# Patient Record
Sex: Male | Born: 1980 | Race: White | Hispanic: No | Smoking: Never smoker
Health system: Southern US, Community
[De-identification: ages and names within clinical notes are randomized; demographics above are authoritative.]

## PROBLEM LIST (undated history)

## (undated) DIAGNOSIS — I10 Essential (primary) hypertension: Secondary | ICD-10-CM

## (undated) DIAGNOSIS — M109 Gout, unspecified: Secondary | ICD-10-CM

---

## 2002-07-08 ENCOUNTER — Emergency Department (HOSPITAL_COMMUNITY): Admission: EM | Admit: 2002-07-08 | Discharge: 2002-07-08 | Payer: Self-pay | Admitting: Emergency Medicine

## 2005-04-13 ENCOUNTER — Emergency Department (HOSPITAL_COMMUNITY): Admission: EM | Admit: 2005-04-13 | Discharge: 2005-04-13 | Payer: Self-pay | Admitting: Emergency Medicine

## 2007-06-29 ENCOUNTER — Emergency Department (HOSPITAL_COMMUNITY): Admission: EM | Admit: 2007-06-29 | Discharge: 2007-06-29 | Payer: Self-pay | Admitting: Family Medicine

## 2010-10-22 ENCOUNTER — Inpatient Hospital Stay (INDEPENDENT_AMBULATORY_CARE_PROVIDER_SITE_OTHER)
Admission: RE | Admit: 2010-10-22 | Discharge: 2010-10-22 | Disposition: A | Discharge status: Home / Self Care | Payer: Self-pay | Source: Ambulatory Visit | Attending: Family Medicine | Admitting: Family Medicine

## 2010-10-22 ENCOUNTER — Emergency Department (HOSPITAL_COMMUNITY)
Admission: EM | Admit: 2010-10-22 | Discharge: 2010-10-22 | Disposition: A | Discharge status: Home / Self Care | Payer: Self-pay | Attending: Emergency Medicine | Admitting: Emergency Medicine

## 2010-10-22 DIAGNOSIS — N201 Calculus of ureter: Secondary | ICD-10-CM

## 2010-10-22 DIAGNOSIS — N509 Disorder of male genital organs, unspecified: Secondary | ICD-10-CM | POA: Insufficient documentation

## 2010-10-22 DIAGNOSIS — R109 Unspecified abdominal pain: Secondary | ICD-10-CM | POA: Insufficient documentation

## 2010-10-22 DIAGNOSIS — R112 Nausea with vomiting, unspecified: Secondary | ICD-10-CM | POA: Insufficient documentation

## 2010-10-22 LAB — URINALYSIS, ROUTINE W REFLEX MICROSCOPIC
Bilirubin Urine: NEGATIVE
Glucose, UA: NEGATIVE mg/dL
Ketones, ur: NEGATIVE mg/dL
Leukocytes, UA: NEGATIVE
Protein, ur: NEGATIVE mg/dL
Specific Gravity, Urine: 1.025 (ref 1.005–1.030)
Urobilinogen, UA: 0.2 mg/dL (ref 0.0–1.0)
pH: 5 (ref 5.0–8.0)

## 2010-10-22 LAB — POCT URINALYSIS DIP (DEVICE)
Bilirubin Urine: NEGATIVE
Glucose, UA: NEGATIVE mg/dL
Ketones, ur: NEGATIVE mg/dL
Nitrite: NEGATIVE
Protein, ur: NEGATIVE mg/dL
Specific Gravity, Urine: >=1.03 (ref 1.005–1.030)
Urobilinogen, UA: 0.2 mg/dL (ref 0.0–1.0)
pH: 5.5 (ref 5.0–8.0)

## 2010-10-22 LAB — URINE MICROSCOPIC-ADD ON

## 2010-11-21 ENCOUNTER — Emergency Department (HOSPITAL_COMMUNITY): Payer: Self-pay

## 2010-11-21 ENCOUNTER — Emergency Department (HOSPITAL_COMMUNITY)
Admission: EM | Admit: 2010-11-21 | Discharge: 2010-11-22 | Disposition: A | Discharge status: Home / Self Care | Payer: Self-pay | Attending: Emergency Medicine | Admitting: Emergency Medicine

## 2010-11-21 DIAGNOSIS — R112 Nausea with vomiting, unspecified: Secondary | ICD-10-CM | POA: Insufficient documentation

## 2010-11-21 DIAGNOSIS — R109 Unspecified abdominal pain: Secondary | ICD-10-CM | POA: Insufficient documentation

## 2010-11-21 DIAGNOSIS — N289 Disorder of kidney and ureter, unspecified: Secondary | ICD-10-CM | POA: Insufficient documentation

## 2010-11-21 DIAGNOSIS — N201 Calculus of ureter: Secondary | ICD-10-CM | POA: Insufficient documentation

## 2010-11-21 LAB — URINALYSIS, ROUTINE W REFLEX MICROSCOPIC
Bilirubin Urine: NEGATIVE
Glucose, UA: NEGATIVE mg/dL
Ketones, ur: NEGATIVE mg/dL
Nitrite: NEGATIVE
Protein, ur: NEGATIVE mg/dL
Urobilinogen, UA: 0.2 mg/dL (ref 0.0–1.0)
pH: 7 (ref 5.0–8.0)

## 2010-11-21 LAB — DIFFERENTIAL
Basophils Absolute: 0 10*3/uL (ref 0.0–0.1)
Basophils Relative: 0 % (ref 0–1)
Eosinophils Absolute: 0.1 10*3/uL (ref 0.0–0.7)
Eosinophils Relative: 1 % (ref 0–5)
Lymphocytes Relative: 7 % — ABNORMAL LOW (ref 12–46)
Lymphs Abs: 1 10*3/uL (ref 0.7–4.0)
Monocytes Absolute: 1 10*3/uL (ref 0.1–1.0)
Monocytes Relative: 7 % (ref 3–12)
Neutro Abs: 13.1 10*3/uL — ABNORMAL HIGH (ref 1.7–7.7)
Neutrophils Relative %: 86 % — ABNORMAL HIGH (ref 43–77)

## 2010-11-21 LAB — COMPREHENSIVE METABOLIC PANEL
ALT: 40 U/L (ref 0–53)
AST: 25 U/L (ref 0–37)
Albumin: 4.2 g/dL (ref 3.5–5.2)
Alkaline Phosphatase: 52 U/L (ref 39–117)
BUN: 22 mg/dL (ref 6–23)
CO2: 27 mEq/L (ref 19–32)
Calcium: 9.4 mg/dL (ref 8.4–10.5)
Chloride: 105 mEq/L (ref 96–112)
Glucose, Bld: 120 mg/dL — ABNORMAL HIGH (ref 70–99)
Potassium: 4.2 mEq/L (ref 3.5–5.1)
Sodium: 141 mEq/L (ref 135–145)
Total Bilirubin: 0.5 mg/dL (ref 0.3–1.2)
Total Protein: 7.4 g/dL (ref 6.0–8.3)

## 2010-11-21 LAB — CBC
HCT: 42.7 % (ref 39.0–52.0)
Hemoglobin: 15 g/dL (ref 13.0–17.0)
MCHC: 35.1 g/dL (ref 30.0–36.0)
MCV: 81.8 fL (ref 78.0–100.0)
RBC: 5.22 MIL/uL (ref 4.22–5.81)
RDW: 12.8 % (ref 11.5–15.5)
WBC: 15.2 10*3/uL — ABNORMAL HIGH (ref 4.0–10.5)

## 2010-11-21 LAB — LIPASE, BLOOD: Lipase: 19 U/L (ref 11–59)

## 2010-11-29 ENCOUNTER — Other Ambulatory Visit: Payer: Self-pay | Admitting: Urology

## 2010-11-29 ENCOUNTER — Encounter (HOSPITAL_COMMUNITY): Payer: Self-pay | Attending: Urology

## 2010-11-29 DIAGNOSIS — Z01818 Encounter for other preprocedural examination: Secondary | ICD-10-CM | POA: Insufficient documentation

## 2010-11-29 DIAGNOSIS — Z01812 Encounter for preprocedural laboratory examination: Secondary | ICD-10-CM | POA: Insufficient documentation

## 2010-11-29 LAB — CBC
HCT: 45 % (ref 39.0–52.0)
Hemoglobin: 15.5 g/dL (ref 13.0–17.0)
MCH: 28.1 pg (ref 26.0–34.0)
MCHC: 34.4 g/dL (ref 30.0–36.0)
MCV: 81.7 fL (ref 78.0–100.0)
Platelets: 242 10*3/uL (ref 150–400)
RBC: 5.51 MIL/uL (ref 4.22–5.81)
RDW: 12.5 % (ref 11.5–15.5)
WBC: 8.8 10*3/uL (ref 4.0–10.5)

## 2010-11-29 LAB — SURGICAL PCR SCREEN: Staphylococcus aureus: POSITIVE — AB

## 2010-12-04 ENCOUNTER — Ambulatory Visit (HOSPITAL_COMMUNITY)
Admission: RE | Admit: 2010-12-04 | Discharge: 2010-12-04 | Disposition: A | Discharge status: Home / Self Care | Payer: Self-pay | Source: Ambulatory Visit | Attending: Urology | Admitting: Urology

## 2010-12-04 DIAGNOSIS — N201 Calculus of ureter: Secondary | ICD-10-CM | POA: Insufficient documentation

## 2010-12-09 NOTE — Op Note (Signed)
  NAMETYHEIM, VANALSTYNE NO.:  [id number]  MEDICAL RECORD NO.:  [id number]           PATIENT TYPE:  O  LOCATION:  DAYL                         FACILITY:  [facility]  PHYSICIAN:  Valetta Fuller, M.D.  DATE OF BIRTH:  1981/04/16  DATE OF PROCEDURE:  12/04/2010 DATE OF DISCHARGE:                              OPERATIVE REPORT   PREOPERATIVE DIAGNOSIS:  Right distal ureteral calculus 6 mm.  POSTOPERATIVE DIAGNOSIS:  Right distal ureteral calculus 6 mm.  PROCEDURE PERFORMED:  Cystoscopy, right retrograde pyelogram, right ureteroscopy with holmium laser lithotripsy, basketing the stone fragments and placement of double-J stent.  SURGEON:  Valetta Fuller, M.D.  ANESTHESIA:  General.  INDICATIONS:  Mr. Rohr is 30 years of age.  He was sent from the emergency room due to a distal right ureteral stone causing obstruction and mild renal insufficiency.  He had been having intermittent testicular discomfort for a number of days.  CT revealed a 4 x 6 mm stone in his distal ureter.  The patient underwent discussion about treatment options.  He elected to proceed with definitive intervention since it appeared the stone has been present in the ureter for approximately a month.  The procedure was discussed with him in detail including pros and cons, potential risks and benefits.  Full informed consent was obtained.  TECHNIQUE AND FINDINGS:  The patient was brought to the operating room. He had successful induction of general anesthesia.  He was placed in lithotomy position and prepped and draped in usual manner.  He received perioperative ciprofloxacin.  Appropriate surgical time-out was performed.  Cystoscopy revealed unremarkable anterior urethra and bladder.  Retrograde pyelogram was done with a cone-tipped catheter revealing a filling defect in the distal right ureter approximately 3 cm to 4 cm above his ureterovesical junction.  Fluoroscopic interpretation was used for  retrograde pyelogram.  A guidewire was placed up to the right renal pelvis.  The right distal ureter was engaged with the rigid ureteroscope but the intramural portions appeared to be somewhat snugged, for that reason it was removed and the inside portion of an access sheath was placed over the wire to perform one-step ureteral dilation.  The distal ureter was then easily engaged with the ureteroscope.  A 6-mm stone was encountered.  We elected to try to fracture this into smaller pieces and a 360-micron holmium laser fiber was utilized with standard settings.  The stone was broken into approximately half a dozen pieces which were each individually basket extracted.  No evidence of ureteral injury. We did elect to place a 6-French 24-cm double-J stent with a dangle string over the guidewire with fluoroscopic as well as visual guidance and the dangle string was secured to his penis.  The patient had no obvious complications and was brought to recovery room in stable condition.     Valetta Fuller, M.D.     DSG/MEDQ  D:  12/04/2010  T:  12/04/2010  Job:  [id number]  Electronically Signed by Barron Alvine M.D. on 12/09/2010 12:14:06 PM

## 2019-12-23 DIAGNOSIS — J309 Allergic rhinitis, unspecified: Secondary | ICD-10-CM | POA: Insufficient documentation

## 2020-10-16 ENCOUNTER — Emergency Department (HOSPITAL_COMMUNITY): Payer: 59

## 2020-10-16 ENCOUNTER — Other Ambulatory Visit: Payer: Self-pay

## 2020-10-16 ENCOUNTER — Encounter (HOSPITAL_COMMUNITY): Payer: Self-pay

## 2020-10-16 ENCOUNTER — Emergency Department (HOSPITAL_COMMUNITY)
Admission: EM | Admit: 2020-10-16 | Discharge: 2020-10-16 | Disposition: A | Discharge status: Home / Self Care | Payer: 59 | Attending: Emergency Medicine | Admitting: Emergency Medicine

## 2020-10-16 DIAGNOSIS — M79671 Pain in right foot: Secondary | ICD-10-CM | POA: Diagnosis not present

## 2020-10-16 DIAGNOSIS — M25571 Pain in right ankle and joints of right foot: Secondary | ICD-10-CM | POA: Insufficient documentation

## 2020-10-16 MED ORDER — PREDNISONE 20 MG PO TABS
40.0000 mg | ORAL_TABLET | Freq: Once | ORAL | Status: AC
  Administered 2020-10-16: 40 mg via ORAL
  Filled 2020-10-16: qty 2

## 2020-10-16 MED ORDER — OXYCODONE-ACETAMINOPHEN 5-325 MG PO TABS: 2.0000 | ORAL_TABLET | Freq: Once | ORAL | Status: DC

## 2020-10-16 MED ORDER — PREDNISONE 20 MG PO TABS: 40.0000 mg | ORAL_TABLET | Freq: Every day | ORAL | 0 refills | Status: DC

## 2020-10-16 MED ORDER — OXYCODONE-ACETAMINOPHEN 5-325 MG PO TABS: 1.0000 | ORAL_TABLET | Freq: Four times a day (QID) | ORAL | 0 refills | Status: DC | PRN

## 2020-10-16 NOTE — ED Notes (Signed)
Pt given crutches, return demonstration performed and questions answered.

## 2020-10-16 NOTE — ED Provider Notes (Signed)
[facility] EMERGENCY DEPARTMENT Provider Note   CSN: [id number] Arrival date & time: 10/16/20  0413     History Chief Complaint  Patient presents with  . Foot Pain    Timothy Bennett is a 40 y.o. male.  Patient with past medical history notable for gout, presents to the emergency department with a chief complaint of right ankle and foot pain.  He denies any known trauma.  States that he does sleep a lot, so he may have bumped it while sleepwalking last night.  He states that he has had gradually worsening pain over the past couple of days.  States that it hurts with bending the ankle.  Denies any fevers or chills.  The history is provided by the patient. No language interpreter was used.       History reviewed. No pertinent past medical history.  There are no problems to display for this patient.   History reviewed. No pertinent surgical history.     History reviewed. No pertinent family history.  Social History   Tobacco Use  . Smoking status: Never Smoker  . Smokeless tobacco: Never Used    Home Medications Prior to Admission medications   Medication Sig Start Date End Date Taking? Authorizing Provider  oxyCODONE-acetaminophen (PERCOCET) 5-325 MG tablet Take 1-2 tablets by mouth every 6 (six) hours as needed. 10/16/20  Yes Roxy Horseman, PA-C  predniSONE (DELTASONE) 20 MG tablet Take 2 tablets (40 mg total) by mouth daily. Take 40 mg by mouth daily for 3 days, then 20mg  by mouth daily for 3 days, then 10mg  daily for 3 days 10/16/20  Yes , PA-C    Allergies    Patient has no allergy information on record.  Review of Systems   Review of Systems  All other systems reviewed and are negative.   Physical Exam Updated Vital Signs BP (!) 150/94 (BP Location: Right Arm)   Pulse 88   Temp 98.7 F (37.1 C)   Resp (!) 22   Ht 5\' 9"  (1.753 m)   Wt 86.2 kg   SpO2 98%   BMI 28.06 kg/m   Physical Exam Vitals and nursing note  reviewed.  Constitutional:      General: He is not in acute distress.    Appearance: He is well-developed. He is not ill-appearing.  HENT:     Head: Normocephalic and atraumatic.  Eyes:     Conjunctiva/sclera: Conjunctivae normal.  Cardiovascular:     Rate and Rhythm: Normal rate.  Pulmonary:     Effort: Pulmonary effort is normal. No respiratory distress.  Abdominal:     General: There is no distension.  Musculoskeletal:     Cervical back: Neck supple.     Comments: Right ankle range of motion and strength is 4/5 limited by pain No obvious swelling  Skin:    General: Skin is warm and dry.     Comments: Mild erythema over the right medial malleolus, no sign of cellulitis or abscess  Neurological:     Mental Status: He is alert and oriented to person, place, and time.  Psychiatric:        Mood and Affect: Mood normal.        Behavior: Behavior normal.     ED Results / Procedures / Treatments   Labs (all labs ordered are listed, but only abnormal results are displayed) Labs Reviewed - No data to display  EKG None  Radiology DG Ankle Complete Right  Result Date: 10/16/2020  CLINICAL DATA:  Right ankle pain. EXAM: RIGHT ANKLE - COMPLETE 3+ VIEW COMPARISON:  None. FINDINGS: Three view radiograph right ankle demonstrates normal alignment. No acute fracture or dislocation. Corticated density seen inferomedial to the medial malleolus likely represents an accessory ossicle or sequela of remote trauma. The ankle mortise is intact. Moderate tibiotalar effusion. Mild bimalleolar soft tissue swelling. Small superior calcaneal spur. IMPRESSION: Ankle effusion. Bimalleolar soft tissue swelling. No acute fracture or dislocation. Electronically Signed   By: Helyn Numbers MD   On: 10/16/2020 05:04   DG Foot Complete Right  Result Date: 10/16/2020 CLINICAL DATA:  Right foot pain EXAM: RIGHT FOOT COMPLETE - 3+ VIEW COMPARISON:  None. FINDINGS: Three view radiograph right foot demonstrates  normal alignment. No fracture or dislocation. Joint spaces are preserved. Moderate tibiotalar effusion. Small superior calcaneal spur. There is soft tissue swelling involving the plantar and medial soft tissues adjacent to the first metatarsal. No retained radiopaque foreign body. IMPRESSION: Soft tissue swelling of the right medial, plantar forefoot. No acute fracture or dislocation. Moderate ankle effusion Electronically Signed   By: Helyn Numbers MD   On: 10/16/2020 05:01    Procedures Procedures   Medications Ordered in ED Medications  predniSONE (DELTASONE) tablet 40 mg (40 mg Oral Given 10/16/20 0522)    ED Course  I have reviewed the triage vital signs and the nursing notes.  Pertinent labs & imaging results that were available during my care of the patient were reviewed by me and considered in my medical decision making (see chart for details).    MDM Rules/Calculators/A&P                          Patient with right ankle and foot pain.  Denies any known trauma.  Has history of gout.  Bedside ultrasound performed by Dr. Pilar Plate, shows small ankle effusion, likely 2 small to be able to obtain fluid sample.  X-ray shows ankle effusion and soft tissue swelling.  My suspicion for septic joint is low given the patient's range of motion, lack of fever, and general appearance.  My suspicion for gout is high.  Will treat with prednisone and Percocet. Final Clinical Impression(s) / ED Diagnoses Final diagnoses:  Acute right ankle pain    Rx / DC Orders ED Discharge Orders         Ordered    predniSONE (DELTASONE) 20 MG tablet  Daily        10/16/20 0523    oxyCODONE-acetaminophen (PERCOCET) 5-325 MG tablet  Every 6 hours PRN        10/16/20 0523           Roxy Horseman, PA-C 10/16/20 0528    Sabas Sous, MD 10/16/20 [phone]

## 2020-10-16 NOTE — ED Provider Notes (Signed)
Ultrasound ED Soft Tissue  Date/Time: 10/16/2020 6:56 AM Performed by: Sabas Sous, MD Authorized by: Sabas Sous, MD   Procedure details:    Indications comment:  MS[facility] ultrasound to evaluate for joint effusion   Transverse view:  Visualized   Longitudinal view:  Visualized   Images: not archived   Location:    Location comment:  Right ankle Comments:     Scant joint effusion      Sabas Sous, MD 10/16/20 [phone]

## 2020-10-16 NOTE — Discharge Instructions (Signed)
Your ankle pain is thought to be from gout.  You have a moderate sized effusion of the ankle joint. This can be caused by gout or infection, but we believe it to be gout based on the size seen on the ultrasound.  Take the medications as prescribed.  Return to the ER for new or worsening symptoms.

## 2020-10-16 NOTE — ED Triage Notes (Addendum)
Pt reports he is here today due to right foot / ankle pain. Pt denies any trauma or recent events to injured foot. Pt reports gout in the left foot but reports the pain is worse and doesn't feel like gout . Pt reports he is unable to bear weight due to pain.

## 2020-10-16 NOTE — ED Notes (Signed)
Pt discharged and ambulated out of the ED with crutches without difficulty.

## 2021-12-11 ENCOUNTER — Ambulatory Visit (HOSPITAL_COMMUNITY)
Admission: EM | Admit: 2021-12-11 | Discharge: 2021-12-11 | Disposition: A | Discharge status: Home / Self Care | Payer: Commercial Managed Care - HMO | Attending: Family Medicine | Admitting: Family Medicine

## 2021-12-11 ENCOUNTER — Encounter (HOSPITAL_COMMUNITY): Payer: Self-pay

## 2021-12-11 DIAGNOSIS — T304 Corrosion of unspecified body region, unspecified degree: Secondary | ICD-10-CM

## 2021-12-11 MED ORDER — SILVER SULFADIAZINE 1 % EX CREA: TOPICAL_CREAM | CUTANEOUS | 0 refills | Status: DC

## 2021-12-11 MED ORDER — SILVER SULFADIAZINE 1 % EX CREA: TOPICAL_CREAM | Freq: Two times a day (BID) | CUTANEOUS | Status: DC

## 2021-12-11 MED ORDER — SILVER SULFADIAZINE 1 % EX CREA
TOPICAL_CREAM | CUTANEOUS | Status: AC
  Filled 2021-12-11: qty 85
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
## 2022-07-26 DIAGNOSIS — Z13228 Encounter for screening for other metabolic disorders: Secondary | ICD-10-CM | POA: Diagnosis not present

## 2022-07-26 DIAGNOSIS — M109 Gout, unspecified: Secondary | ICD-10-CM | POA: Diagnosis not present

## 2022-08-10 DIAGNOSIS — E559 Vitamin D deficiency, unspecified: Secondary | ICD-10-CM | POA: Diagnosis not present

## 2022-08-10 DIAGNOSIS — M545 Low back pain, unspecified: Secondary | ICD-10-CM | POA: Diagnosis not present

## 2022-08-10 DIAGNOSIS — I10 Essential (primary) hypertension: Secondary | ICD-10-CM | POA: Diagnosis not present

## 2022-12-06 ENCOUNTER — Ambulatory Visit (HOSPITAL_COMMUNITY)
Admission: EM | Admit: 2022-12-06 | Discharge: 2022-12-06 | Disposition: A | Discharge status: Home / Self Care | Payer: 59 | Attending: Orthopedic Surgery | Admitting: Orthopedic Surgery

## 2022-12-06 ENCOUNTER — Encounter (HOSPITAL_COMMUNITY): Payer: Self-pay

## 2022-12-06 DIAGNOSIS — M109 Gout, unspecified: Secondary | ICD-10-CM

## 2022-12-06 HISTORY — DX: Gout, unspecified: M10.9

## 2022-12-06 HISTORY — DX: Essential (primary) hypertension: I10

## 2022-12-06 MED ORDER — PREDNISONE 10 MG PO TABS: 10.0000 mg | ORAL_TABLET | Freq: Every day | ORAL | 0 refills | Status: DC

## 2022-12-06 MED ORDER — HYDROCODONE-ACETAMINOPHEN 5-325 MG PO TABS: 1.0000 | ORAL_TABLET | ORAL | 0 refills | Status: DC | PRN

## 2022-12-06 NOTE — Discharge Instructions (Signed)
Please rest ice elevate left foot.  Make sure you are following a low purine diet.  Return for any fevers warmth redness or any urgent changes in health
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
## 2022-12-06 NOTE — ED Triage Notes (Signed)
Patient reports that he has had left foot pain x 1 week, but worse in the past 3 days. Patient reports a history of gout.  Patient reports tht states he has been taking aleve and the last dose was yesterday.

## 2023-02-14 IMAGING — CR DG ANKLE COMPLETE 3+V*R*
3 series · 3 of 3 positions shown · non-contrast
Comparison: None.

CLINICAL DATA: Right ankle pain.

EXAM:
RIGHT ANKLE - COMPLETE 3+ VIEW

[ankle ap]
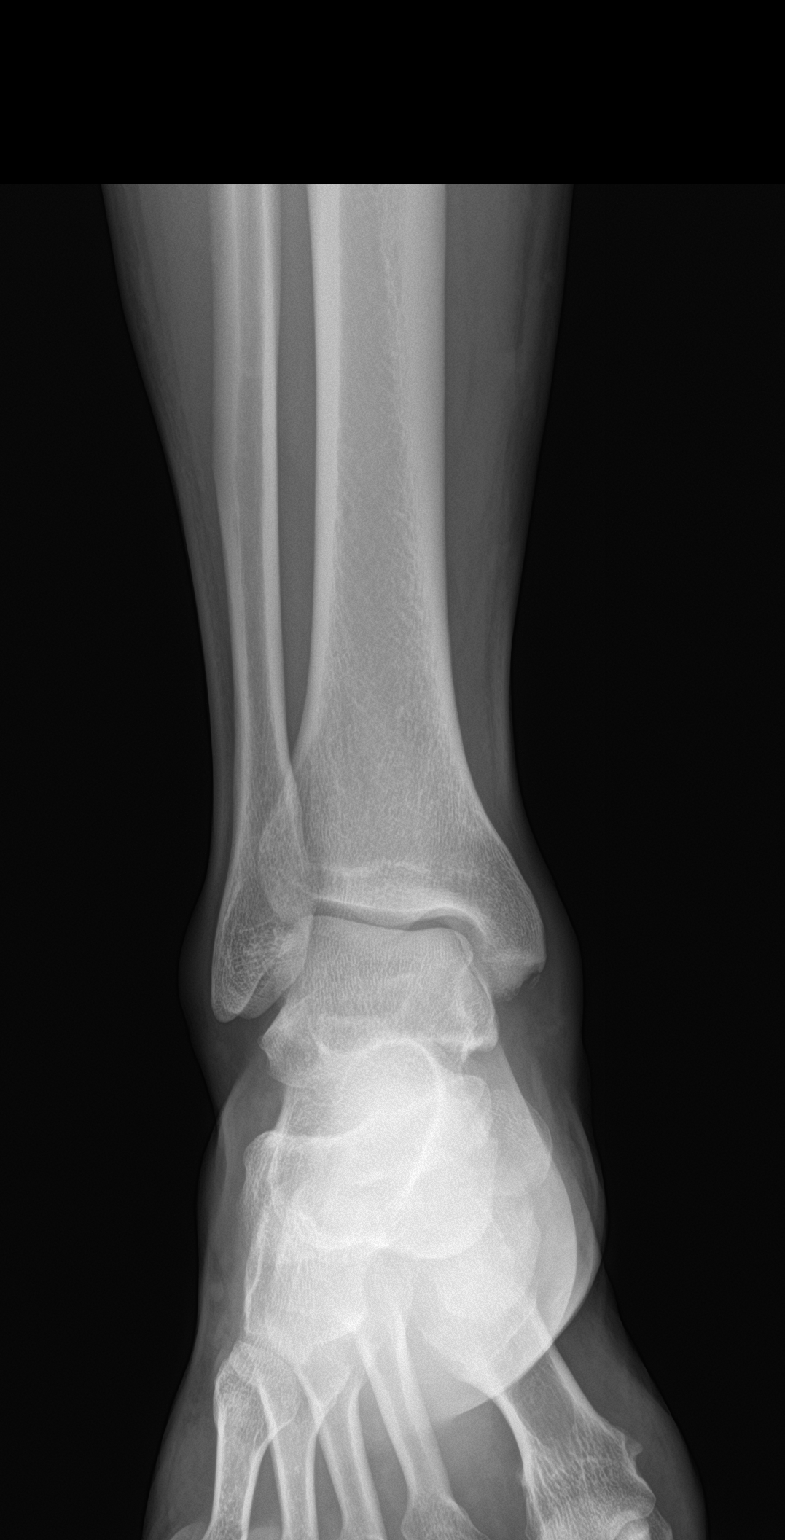

[ankle obl]
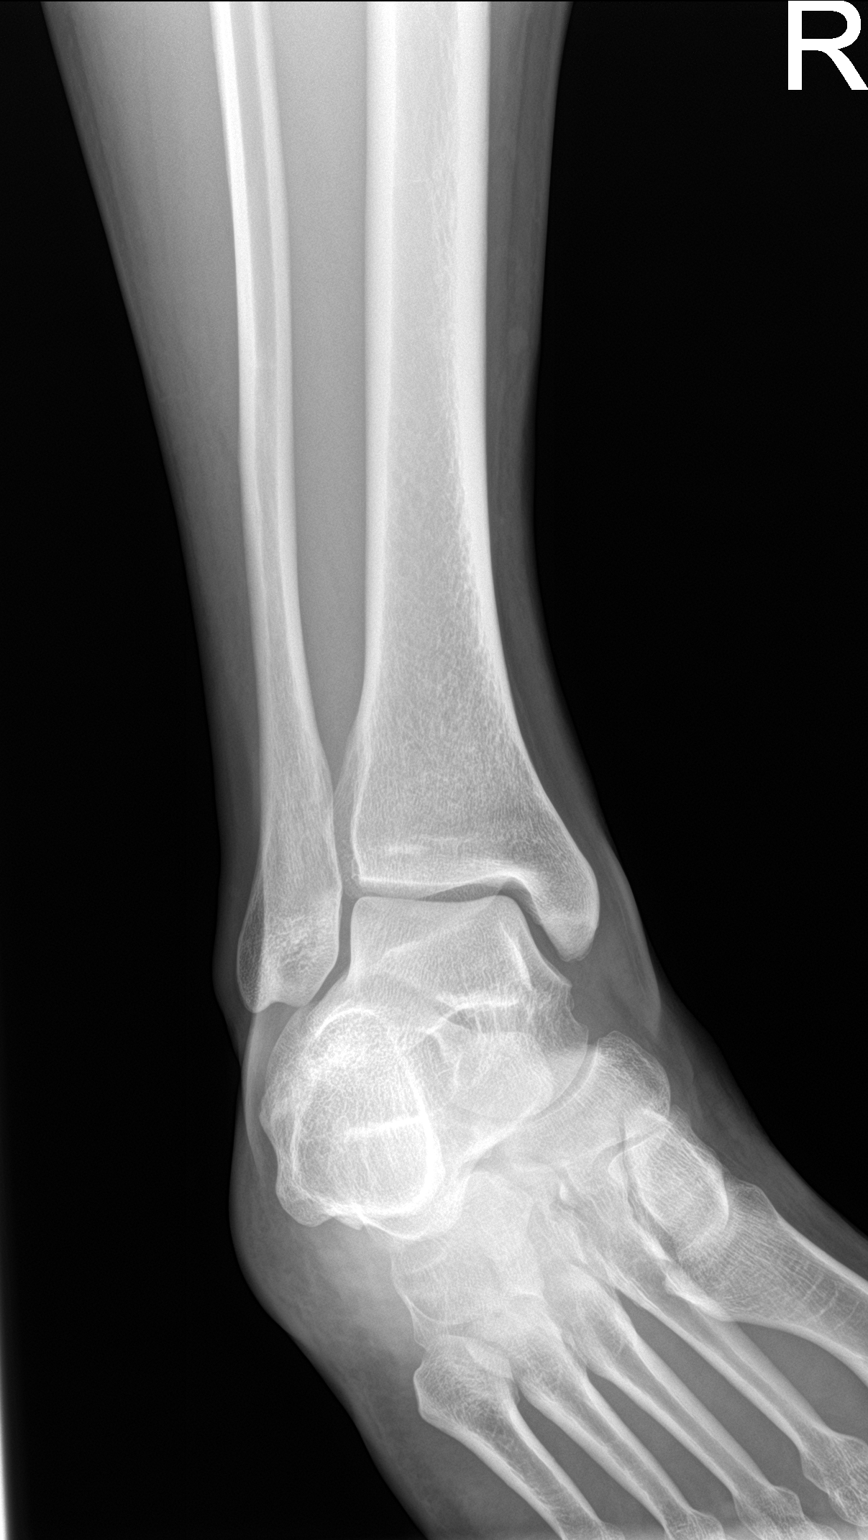

[ankle lat]
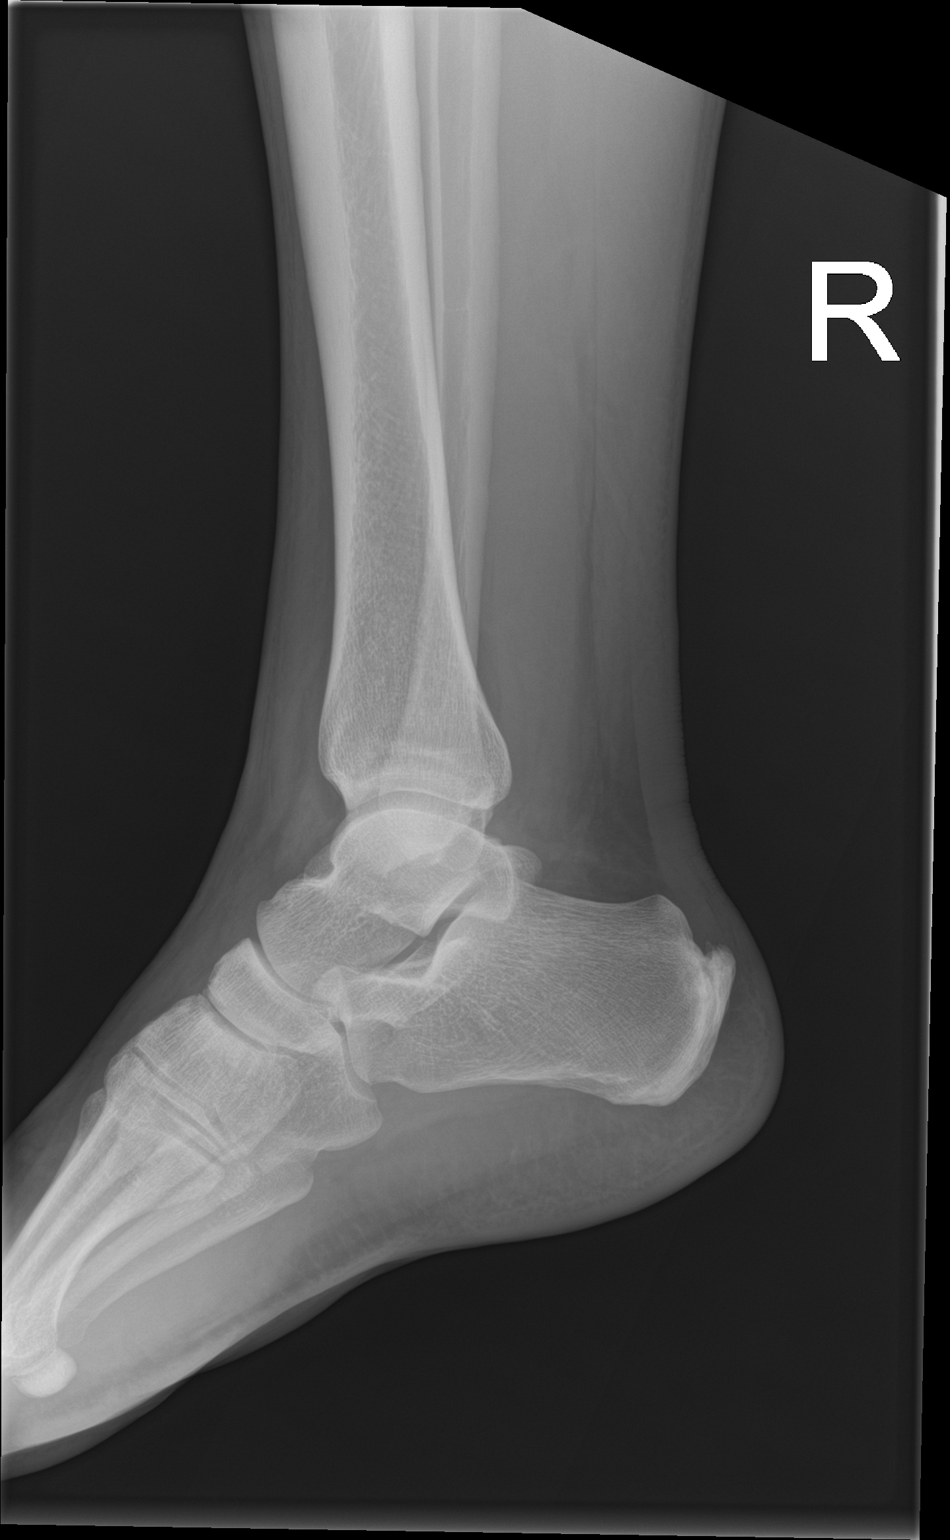

[3 of 3 positions shown; findings below may reference images not displayed]

FINDINGS: Three view radiograph right ankle demonstrates normal alignment. No
acute fracture or dislocation. Corticated density seen inferomedial
to the medial malleolus likely represents an accessory ossicle or
sequela of remote trauma. The ankle mortise is intact. Moderate
tibiotalar effusion. Mild bimalleolar soft tissue swelling. Small
superior calcaneal spur.
IMPRESSION: Ankle effusion. Bimalleolar soft tissue swelling. No acute fracture
or dislocation.

## 2023-02-26 ENCOUNTER — Ambulatory Visit (INDEPENDENT_AMBULATORY_CARE_PROVIDER_SITE_OTHER): Payer: 59

## 2023-02-26 ENCOUNTER — Ambulatory Visit (HOSPITAL_COMMUNITY)
Admission: EM | Admit: 2023-02-26 | Discharge: 2023-02-26 | Disposition: A | Discharge status: Home / Self Care | Payer: 59

## 2023-02-26 ENCOUNTER — Encounter (HOSPITAL_COMMUNITY): Payer: Self-pay

## 2023-02-26 DIAGNOSIS — M79602 Pain in left arm: Secondary | ICD-10-CM | POA: Diagnosis not present

## 2023-02-26 DIAGNOSIS — M25512 Pain in left shoulder: Secondary | ICD-10-CM | POA: Diagnosis not present

## 2023-02-26 DIAGNOSIS — W19XXXA Unspecified fall, initial encounter: Secondary | ICD-10-CM

## 2023-02-26 DIAGNOSIS — Z043 Encounter for examination and observation following other accident: Secondary | ICD-10-CM | POA: Diagnosis not present

## 2023-02-26 DIAGNOSIS — S4992XA Unspecified injury of left shoulder and upper arm, initial encounter: Secondary | ICD-10-CM | POA: Diagnosis not present

## 2023-02-26 DIAGNOSIS — M25522 Pain in left elbow: Secondary | ICD-10-CM | POA: Diagnosis not present

## 2023-02-26 MED ORDER — IBUPROFEN 800 MG PO TABS
ORAL_TABLET | ORAL | Status: AC
  Filled 2023-02-26: qty 1

## 2023-02-26 MED ORDER — IBUPROFEN 800 MG PO TABS: 800.0000 mg | ORAL_TABLET | Freq: Three times a day (TID) | ORAL | 0 refills | Status: DC

## 2023-02-26 MED ORDER — IBUPROFEN 800 MG PO TABS
800.0000 mg | ORAL_TABLET | Freq: Once | ORAL | Status: AC
  Administered 2023-02-26: 800 mg via ORAL

## 2023-02-26 MED ORDER — HYDROCODONE-ACETAMINOPHEN 5-325 MG PO TABS
1.0000 | ORAL_TABLET | Freq: Four times a day (QID) | ORAL | 0 refills | Status: AC | PRN
Start: 2023-02-26 — End: 2023-02-28

## 2023-02-26 NOTE — ED Provider Notes (Signed)
[facility]    CSN: [id number] Arrival date & time: 02/26/23  1815      History   Chief Complaint Chief Complaint  Patient presents with   Fall   Arm Injury    HPI Timothy Bennett is a 42 y.o. male.   Patient was on a step stool in his backyard this morning when he accidentally fell off of the step stool and onto the left shoulder/bicep onto grass. Step stool was 2 feet high. He was trying to step down from the step stool and ended up with a mechanical fall. He did not hit head, did not pass out, and did not vomit after falling. Immediately started having pain to the left shoulder that radiates distally towards the left bicep and left elbow. Pain is worse with movement of the shoulder. Pain is localized and most intense to the lateral aspect of the right shoulder and is currently 3/10 without movement and 9/10 with movement. No previous injury to the left shoulder or left elbow.    Fall  Arm Injury   Past Medical History:  Diagnosis Date   Gout    Hypertension     There are no problems to display for this patient.   History reviewed. No pertinent surgical history.     Home Medications    Prior to Admission medications   Medication Sig Start Date End Date Taking? Authorizing Provider  amLODipine (NORVASC) 5 MG tablet Take 5 mg by mouth daily.   Yes [provider]  Cholecalciferol 1.25 MG (50000 UT) capsule Take by mouth daily. 08/01/22  Yes [provider]  HYDROcodone-acetaminophen (NORCO/VICODIN) 5-325 MG tablet Take 1 tablet by mouth every 6 (six) hours as needed for up to 2 days. 02/26/23 02/28/23 Yes Carlisle Beers, FNP  predniSONE (DELTASONE) 10 MG tablet Take 1 tablet (10 mg total) by mouth daily. 6,5,4,3,2,1 six day taper 12/06/22   Evon Slack, PA-C  silver sulfADIAZINE (SILVADENE) 1 % cream Apply to burn with dressing changes. 12/11/21   Mardella Layman, MD    Family History Family History  Family history unknown: Yes     Social History Social History   Tobacco Use   Smoking status: Never   Smokeless tobacco: Never  Vaping Use   Vaping status: Never Used  Substance Use Topics   Alcohol use: Not Currently   Drug use: Never     Allergies   Patient has no known allergies.   Review of Systems Review of Systems   Physical Exam Triage Vital Signs ED Triage Vitals  Encounter Vitals Group     BP 02/26/23 1842 (!) 142/88     Systolic BP Percentile --      Diastolic BP Percentile --      Pulse Rate 02/26/23 1842 60     Resp 02/26/23 1842 18     Temp 02/26/23 1842 98.5 F (36.9 C)     Temp Source 02/26/23 1842 Oral     SpO2 02/26/23 1842 95 %     Weight 02/26/23 1841 200 lb (90.7 kg)     Height 02/26/23 1841 5\' 8"  (1.727 m)     Head Circumference --      Peak Flow --      Pain Score 02/26/23 1839 8     Pain Loc --      Pain Education --      Exclude from Growth Chart --    No data found.  Updated Vital Signs BP Marland Kitchen)  142/88 (BP Location: Right Arm)   Pulse 60   Temp 98.5 F (36.9 C) (Oral)   Resp 18   Ht 5\' 8"  (1.727 m)   Wt 200 lb (90.7 kg)   SpO2 95%   BMI 30.41 kg/m   Visual Acuity Right Eye Distance:   Left Eye Distance:   Bilateral Distance:    Right Eye Near:   Left Eye Near:    Bilateral Near:     Physical Exam Vitals and nursing note reviewed.  Constitutional:      Appearance: He is not ill-appearing or toxic-appearing.  HENT:     Head: Normocephalic and atraumatic.     Right Ear: Hearing and external ear normal.     Left Ear: Hearing and external ear normal.     Nose: Nose normal.     Mouth/Throat:     Lips: Pink.  Eyes:     General: Lids are normal. Vision grossly intact. Gaze aligned appropriately.     Extraocular Movements: Extraocular movements intact.     Conjunctiva/sclera: Conjunctivae normal.  Cardiovascular:     Rate and Rhythm: Normal rate and regular rhythm.     Heart sounds: Normal heart sounds, S1 normal and S2 normal.  Pulmonary:      Effort: Pulmonary effort is normal. No respiratory distress.     Breath sounds: Normal breath sounds and air entry.  Musculoskeletal:     Right shoulder: Normal.     Left shoulder: Tenderness (TTP to the anterolateral aspect of the left shoulder joint) and bony tenderness present. No swelling (No soft tissue swelling or ecchymosis), deformity, effusion, laceration or crepitus. Decreased range of motion (significantly decreased ROM with abduction at the left shoulder joint secondary to pain). Normal strength. Normal pulse.     Right elbow: Normal.     Left elbow: Normal.     Cervical back: Neck supple.     Comments: 5/5 grip st strength bilateral upper extremities.  Sensation intact distally.  +2 bilateral brachial pulses.  Skin:    General: Skin is warm and dry.     Capillary Refill: Capillary refill takes less than 2 seconds.     Findings: No rash.  Neurological:     General: No focal deficit present.     Mental Status: He is alert and oriented to person, place, and time. Mental status is at baseline.     Cranial Nerves: No dysarthria or facial asymmetry.  Psychiatric:        Mood and Affect: Mood normal.        Speech: Speech normal.        Behavior: Behavior normal.        Thought Content: Thought content normal.        Judgment: Judgment normal.      UC Treatments / Results  Labs (all labs ordered are listed, but only abnormal results are displayed) Labs Reviewed - No data to display  EKG   Radiology No results found.  Procedures Procedures (including critical care time)  Medications Ordered in UC Medications - No data to display  Initial Impression / Assessment and Plan / UC Course  I have reviewed the triage vital signs and the nursing notes.  Pertinent labs & imaging results that were available during my care of the patient were reviewed by me and considered in my medical decision making (see chart for details).   1.  Fall, left shoulder pain Left shoulder  x-ray shows irregularity to the acromion concerning  for possible fracture by my interpretation. 2 other providers reviewed images as well and agree with concern/interpretation. Radiology will re-read images and patient to be called if interpretation differs from mine in clinic. Due to concern for fracture given clinical presentation and imaging findings, will treat with shoulder immobilizer. Close follow-up with orthopedics encouraged.  Phone number for [facility] or [facility] provided on paperwork.   May use ibuprofen as needed for pain.  If pain is not well-controlled by ibuprofen, may use hydrocodone 1 pill every 6 hours as needed for severe pain.  Discussed drowsiness precautions given hydrocodone use.  PDMP reviewed.  2 days worth of hydrocodone pills sent to pharmacy due to concern for fracture.   Counseled patient on potential for adverse effects with medications prescribed/recommended today, strict ER and return-to-clinic precautions discussed, patient verbalized understanding.    Final Clinical Impressions(s) / UC Diagnoses   Final diagnoses:  Fall, initial encounter     Discharge Instructions      I am concerned that you may have fractured your left shoulder. Keep your left shoulder/arm in the shoulder immobilizer until your follow-up appointment with orthopedics. Call orthopedics tomorrow to schedule an appointment for soon as possible for further workup and evaluation. Their phone number is on your paperwork.  Please take ibuprofen 800mg  every 8 hours and/or tylenol regular strength 650mg  every 6 hours as needed with food for pain. You may purchase these medications over the counter (4 200mg  ibuprofen pills= 800mg ).   If you still have pain despite taking ibuprofen regularly, this is called breakthrough pain.  You can use hydrocodone, a narcotic pain medicine, once every 4-6 hours for this.  Once your pain is better controlled, switch back to just ibuprofen/tylenol.   Avoid use of extra strength tylenol while taking hydrocodone since this medication already has tylenol in it.   Return if you experience worsening pain, numbness, tingling, skin color changes, or any other concerning symptoms. If symptoms are severe, please go to the ER. I hope you feel better!!      ED Prescriptions     Medication Sig Dispense Auth. Provider   HYDROcodone-acetaminophen (NORCO/VICODIN) 5-325 MG tablet Take 1 tablet by mouth every 6 (six) hours as needed for up to 2 days. 8 tablet Carlisle Beers, FNP      I have reviewed the PDMP during this encounter.   Carlisle Beers, [place] 02/26/23 1944

## 2023-02-26 NOTE — Discharge Instructions (Signed)
I am concerned that you may have fractured your left shoulder. Keep your left shoulder/arm in the shoulder immobilizer until your follow-up appointment with orthopedics. Call orthopedics tomorrow to schedule an appointment for soon as possible for further workup and evaluation. Their phone number is on your paperwork.  Please take ibuprofen 800mg  every 8 hours and/or tylenol regular strength 650mg  every 6 hours as needed with food for pain. You may purchase these medications over the counter (4 200mg  ibuprofen pills= 800mg ).   If you still have pain despite taking ibuprofen regularly, this is called breakthrough pain.  You can use hydrocodone, a narcotic pain medicine, once every 4-6 hours for this.  Once your pain is better controlled, switch back to just ibuprofen/tylenol.  Avoid use of extra strength tylenol while taking hydrocodone since this medication already has tylenol in it.   Return if you experience worsening pain, numbness, tingling, skin color changes, or any other concerning symptoms. If symptoms are severe, please go to the ER. I hope you feel better!!

## 2023-02-26 NOTE — ED Triage Notes (Signed)
Patient fell around 1030 this morning while on a step stool and landed on the left shoulder and bicep. Having pain in the left shoulder. Pain shoots down the arm when trying to straighten the elbow. Reduced ROM I the shoulder as well.

## 2023-02-27 ENCOUNTER — Telehealth (HOSPITAL_COMMUNITY): Payer: Self-pay | Admitting: *Deleted

## 2023-02-27 NOTE — Telephone Encounter (Signed)
Pt called and [facility] is out of hydrocodone 5/325, I called pharm to confirm.    Spoke with providers on staff today they advised to take IBU 800mg  since it was given as well. If he is still in pain he can call back tomorrow and see if the MD on staff would change the meds.    I advised pt and he states he will go and pcik up the IBU 800mg  he didn't pick it up yesterday. He states he isnt in a lot of pain just some discomfort he hasn't been taking any IBU yet.

## 2023-03-02 DIAGNOSIS — S4352XA Sprain of left acromioclavicular joint, initial encounter: Secondary | ICD-10-CM | POA: Diagnosis not present

## 2023-03-16 DIAGNOSIS — S4352XD Sprain of left acromioclavicular joint, subsequent encounter: Secondary | ICD-10-CM | POA: Diagnosis not present

## 2023-04-13 DIAGNOSIS — S4352XD Sprain of left acromioclavicular joint, subsequent encounter: Secondary | ICD-10-CM | POA: Diagnosis not present

## 2023-04-17 DIAGNOSIS — M7522 Bicipital tendinitis, left shoulder: Secondary | ICD-10-CM | POA: Diagnosis not present

## 2023-04-17 DIAGNOSIS — S4352XD Sprain of left acromioclavicular joint, subsequent encounter: Secondary | ICD-10-CM | POA: Diagnosis not present

## 2023-04-17 DIAGNOSIS — M7542 Impingement syndrome of left shoulder: Secondary | ICD-10-CM | POA: Diagnosis not present

## 2023-04-23 DIAGNOSIS — M7542 Impingement syndrome of left shoulder: Secondary | ICD-10-CM | POA: Diagnosis not present

## 2023-04-23 DIAGNOSIS — S4352XD Sprain of left acromioclavicular joint, subsequent encounter: Secondary | ICD-10-CM | POA: Diagnosis not present

## 2023-04-23 DIAGNOSIS — M7522 Bicipital tendinitis, left shoulder: Secondary | ICD-10-CM | POA: Diagnosis not present

## 2023-04-29 DIAGNOSIS — M7542 Impingement syndrome of left shoulder: Secondary | ICD-10-CM | POA: Diagnosis not present

## 2023-04-29 DIAGNOSIS — S4352XD Sprain of left acromioclavicular joint, subsequent encounter: Secondary | ICD-10-CM | POA: Diagnosis not present

## 2023-04-29 DIAGNOSIS — M7522 Bicipital tendinitis, left shoulder: Secondary | ICD-10-CM | POA: Diagnosis not present

## 2023-05-07 DIAGNOSIS — M7522 Bicipital tendinitis, left shoulder: Secondary | ICD-10-CM | POA: Diagnosis not present

## 2023-05-07 DIAGNOSIS — M7542 Impingement syndrome of left shoulder: Secondary | ICD-10-CM | POA: Diagnosis not present

## 2023-05-07 DIAGNOSIS — S4352XD Sprain of left acromioclavicular joint, subsequent encounter: Secondary | ICD-10-CM | POA: Diagnosis not present

## 2023-05-11 DIAGNOSIS — M25512 Pain in left shoulder: Secondary | ICD-10-CM | POA: Diagnosis not present

## 2023-05-26 DIAGNOSIS — M25512 Pain in left shoulder: Secondary | ICD-10-CM | POA: Diagnosis not present

## 2023-06-10 DIAGNOSIS — M25512 Pain in left shoulder: Secondary | ICD-10-CM | POA: Diagnosis not present

## 2023-09-07 ENCOUNTER — Ambulatory Visit: Payer: 59 | Admitting: Family Medicine

## 2023-09-08 ENCOUNTER — Ambulatory Visit: Payer: 59 | Admitting: Family Medicine

## 2023-09-08 ENCOUNTER — Encounter: Payer: Self-pay | Admitting: Family Medicine

## 2023-09-08 VITALS — BP 138/80 | HR 73 | Temp 98.3°F | Ht 68.0 in | Wt 207.0 lb

## 2023-09-08 DIAGNOSIS — Z113 Encounter for screening for infections with a predominantly sexual mode of transmission: Secondary | ICD-10-CM | POA: Diagnosis not present

## 2023-09-08 DIAGNOSIS — Z1159 Encounter for screening for other viral diseases: Secondary | ICD-10-CM | POA: Diagnosis not present

## 2023-09-08 DIAGNOSIS — Z114 Encounter for screening for human immunodeficiency virus [HIV]: Secondary | ICD-10-CM

## 2023-09-08 DIAGNOSIS — E66811 Obesity, class 1: Secondary | ICD-10-CM

## 2023-09-08 DIAGNOSIS — Z0001 Encounter for general adult medical examination with abnormal findings: Secondary | ICD-10-CM

## 2023-09-08 DIAGNOSIS — Z13220 Encounter for screening for lipoid disorders: Secondary | ICD-10-CM | POA: Diagnosis not present

## 2023-09-08 DIAGNOSIS — Z6831 Body mass index (BMI) 31.0-31.9, adult: Secondary | ICD-10-CM

## 2023-09-08 DIAGNOSIS — B001 Herpesviral vesicular dermatitis: Secondary | ICD-10-CM

## 2023-09-08 DIAGNOSIS — Z0000 Encounter for general adult medical examination without abnormal findings: Secondary | ICD-10-CM | POA: Diagnosis not present

## 2023-09-08 DIAGNOSIS — Z23 Encounter for immunization: Secondary | ICD-10-CM | POA: Diagnosis not present

## 2023-09-08 NOTE — Patient Instructions (Signed)
 It was great to meet you today and I'm excited to have you join the [facility] practice. I hope you had a positive experience today! If you feel so inclined, please feel free to recommend our practice to friends and family. Kurtis Bushman, FNP-C

## 2023-09-08 NOTE — Assessment & Plan Note (Signed)

## 2023-09-08 NOTE — Assessment & Plan Note (Signed)
 Counseled on importance of weight management for overall health. Encouraged low but adequate calorie, heart healthy diet and moderate intensity exercise 150 minutes weekly. This is 3-5 times weekly for 30-50 minutes each session. Goal should be pace of 3 miles/hours, or walking 1.5 miles in 30 minutes and include strength training. Discussed risks of obesity. Will refer to [facility].

## 2023-09-08 NOTE — Progress Notes (Signed)
 New Patient Office Visit  Subjective    Patient ID: Timothy Bennett, male    DOB: April 27, 1981  Age: 43 y.o. MRN: [id number]  CC: No chief complaint on file.   HPI Timothy Bennett presents to establish care. Oriented to practice routines and expectations. Over 1 year since PCP.  PMH includes gout, HTN. Concerns include weight management. He does admit to eating fast food for lunch and unhealthy choices for dinner such as pizza, steak, burgers. He does walk/run up to 2 miles 5 days weeks. Has tried weight loss in past by reducing calories to 400 per day.  Tobacco: non-smoker STI: declines Vaccines:  Tdap today, declines flu, covid    Outpatient Encounter Medications as of 09/08/2023  Medication Sig   amLODipine (NORVASC) 5 MG tablet Take 5 mg by mouth daily. (Patient not taking: Reported on 09/08/2023)   Cholecalciferol 1.25 MG (50000 UT) capsule Take by mouth daily. (Patient not taking: Reported on 09/08/2023)   ibuprofen (ADVIL) 800 MG tablet Take 1 tablet (800 mg total) by mouth 3 (three) times daily. (Patient not taking: Reported on 09/08/2023)   predniSONE (DELTASONE) 10 MG tablet Take 1 tablet (10 mg total) by mouth daily. 6,5,4,3,2,1 six day taper (Patient not taking: Reported on 09/08/2023)   silver sulfADIAZINE (SILVADENE) 1 % cream Apply to burn with dressing changes. (Patient not taking: Reported on 09/08/2023)   No facility-administered encounter medications on file as of 09/08/2023.    Past Medical History:  Diagnosis Date   Gout    Hypertension     History reviewed. No pertinent surgical history.  Family History  Family history unknown: Yes    Social History   Socioeconomic History   Marital status: Legally Separated    Spouse name: Not on file   Number of children: Not on file   Years of education: Not on file   Highest education level: GED or equivalent  Occupational History   Not on file  Tobacco Use   Smoking status: Never   Smokeless tobacco: Never  Vaping Use    Vaping status: Never Used  Substance and Sexual Activity   Alcohol use: Not Currently   Drug use: Never   Sexual activity: Yes  Other Topics Concern   Not on file  Social History Narrative   Not on file   Social Drivers of Health   Financial Resource Strain: Low Risk  (09/07/2023)   Overall Financial Resource Strain (CARDIA)    Difficulty of Paying Living Expenses: Not very hard  Food Insecurity: No Food Insecurity (09/07/2023)   Hunger Vital Sign    Worried About Running Out of Food in the Last Year: Never true    Ran Out of Food in the Last Year: Never true  Transportation Needs: No Transportation Needs (09/07/2023)   PRAPARE - Administrator, Civil Service (Medical): No    Lack of Transportation (Non-Medical): No  Physical Activity: Insufficiently Active (09/07/2023)   Exercise Vital Sign    Days of Exercise per Week: 3 days    Minutes of Exercise per Session: 20 min  Stress: Stress Concern Present (09/07/2023)   [employer] of Occupational Health - Occupational Stress Questionnaire    Feeling of Stress : To some extent  Social Connections: Moderately Isolated (09/07/2023)   Social Connection and Isolation Panel [NHANES]    Frequency of Communication with Friends and Family: Twice a week    Frequency of Social Gatherings with Friends and Family: Twice a week  Attends Religious Services: Never    Active Member of Clubs or Organizations: No    Attends Banker Meetings: Not on file    Marital Status: Married  Intimate Partner Violence: Unknown (10/11/2021)   Received from [employer], [facility]   HITS    Physically Hurt: Not on file    Insult or Talk Down To: Not on file    Threaten Physical Harm: Not on file    Scream or Curse: Not on file    Review of Systems  Constitutional: Negative.   HENT: Negative.    Eyes: Negative.   Respiratory: Negative.    Cardiovascular: Negative.   Gastrointestinal: Negative.  Negative for abdominal pain,  blood in stool, constipation, diarrhea, melena, nausea and vomiting.       Frequent formed stools  Genitourinary: Negative.   Musculoskeletal: Negative.   Skin: Negative.        Cold sores  Neurological:  Positive for headaches.  Endo/Heme/Allergies: Negative.   Psychiatric/Behavioral: Negative.    All other systems reviewed and are negative.       Objective    BP 138/80   Pulse 73   Temp 98.3 F (36.8 C) (Oral)   Ht 5\' 8"  (1.727 m)   Wt 207 lb (93.9 kg)   SpO2 94%   BMI 31.47 kg/m   Physical Exam Vitals and nursing note reviewed.  Constitutional:      Appearance: Normal appearance. He is obese.  HENT:     Head: Normocephalic and atraumatic.     Right Ear: Tympanic membrane, ear canal and external ear normal.     Left Ear: Tympanic membrane, ear canal and external ear normal.     Nose: Nose normal.     Mouth/Throat:     Mouth: Mucous membranes are moist.     Pharynx: Oropharynx is clear.  Eyes:     Extraocular Movements: Extraocular movements intact.     Right eye: Normal extraocular motion and no nystagmus.     Left eye: Normal extraocular motion and no nystagmus.     Conjunctiva/sclera: Conjunctivae normal.     Pupils: Pupils are equal, round, and reactive to light.  Cardiovascular:     Rate and Rhythm: Normal rate and regular rhythm.     Pulses: Normal pulses.     Heart sounds: Normal heart sounds.  Pulmonary:     Effort: Pulmonary effort is normal.     Breath sounds: Normal breath sounds.  Abdominal:     General: Bowel sounds are normal.     Palpations: Abdomen is soft.  Genitourinary:    Comments: Deferred using shared decision making Musculoskeletal:        General: Normal range of motion.     Cervical back: Normal range of motion and neck supple.  Skin:    General: Skin is warm and dry.     Capillary Refill: Capillary refill takes less than 2 seconds.     Findings: Lesion present.       Neurological:     General: No focal deficit present.      Mental Status: He is alert. Mental status is at baseline.  Psychiatric:        Mood and Affect: Mood normal.        Speech: Speech normal.        Behavior: Behavior normal.        Thought Content: Thought content normal.        Cognition and Memory: Cognition and  memory normal.        Judgment: Judgment normal.         Assessment & Plan:   Problem List Items Addressed This Visit     Physical exam, annual - Primary   Today your medical history was reviewed and routine physical exam with labs was performed. Recommend 150 minutes of moderate intensity exercise weekly and consuming a well-balanced diet. Advised to stop smoking if a smoker, avoid smoking if a non-smoker, limit alcohol consumption to 1 drink per day for women and 2 drinks per day for men, and avoid illicit drug use. Counseled on safe sex practices and offered STI testing today. Counseled on the importance of sunscreen use. Counseled in mental health awareness and when to seek medical care. Vaccine maintenance discussed. Appropriate health maintenance items reviewed. Return to office in 1 year for annual physical exam.       Relevant Orders   CBC with Differential/Platelet   COMPLETE METABOLIC PANEL WITH GFR   Lipid panel   Hepatitis C antibody   HIV Antibody (routine testing w rflx)   Class 1 obesity with serious comorbidity and body mass index (BMI) of 31.0 to 31.9 in adult   Counseled on importance of weight management for overall health. Encouraged low but adequate calorie, heart healthy diet and moderate intensity exercise 150 minutes weekly. This is 3-5 times weekly for 30-50 minutes each session. Goal should be pace of 3 miles/hours, or walking 1.5 miles in 30 minutes and include strength training. Discussed risks of obesity. Will refer to [facility]WM.       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 Will test today and treat  as indicated. CMP and HSV.      Other Visit Diagnoses       Screening for lipoid disorders       Relevant Orders   Lipid panel     Need for hepatitis C screening test       Relevant Orders   Hepatitis C antibody     Screening for HIV (human immunodeficiency virus)       Relevant Orders   HIV Antibody (routine testing w rflx)     Routine screening for STI (sexually transmitted infection)       Relevant Orders   HSV(herpes simplex vrs) 1+2 ab-IgG       Return in about 1 week (around 09/15/2023) for follow-up.   Park Meo, FNP

## 2023-09-08 NOTE — Assessment & Plan Note (Signed)
 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 Will test today and treat as indicated. CMP and HSV.

## 2023-09-09 LAB — COMPLETE METABOLIC PANEL WITH GFR
AG Ratio: 1.8 (calc) (ref 1.0–2.5)
ALT: 29 U/L (ref 9–46)
AST: 18 U/L (ref 10–40)
Albumin: 4.5 g/dL (ref 3.6–5.1)
Alkaline phosphatase (APISO): 53 U/L (ref 36–130)
BUN: 17 mg/dL (ref 7–25)
CO2: 27 mmol/L (ref 20–32)
Calcium: 9.4 mg/dL (ref 8.6–10.3)
Chloride: 104 mmol/L (ref 98–110)
Creat: 0.97 mg/dL (ref 0.60–1.29)
Globulin: 2.5 g/dL (ref 1.9–3.7)
Glucose, Bld: 97 mg/dL (ref 65–99)
Potassium: 4.1 mmol/L (ref 3.5–5.3)
Sodium: 141 mmol/L (ref 135–146)
Total Bilirubin: 0.5 mg/dL (ref 0.2–1.2)
Total Protein: 7 g/dL (ref 6.1–8.1)
eGFR: 101 mL/min/{1.73_m2} (ref >=60)

## 2023-09-09 LAB — CBC WITH DIFFERENTIAL/PLATELET
Absolute Lymphocytes: 1566 {cells}/uL (ref 850–3900)
Absolute Monocytes: 549 {cells}/uL (ref 200–950)
Basophils Absolute: 36 {cells}/uL (ref 0–200)
Basophils Relative: 0.4 %
Eosinophils Absolute: 189 {cells}/uL (ref 15–500)
Eosinophils Relative: 2.1 %
HCT: 52.5 % — ABNORMAL HIGH (ref 38.5–50.0)
Hemoglobin: 17.3 g/dL — ABNORMAL HIGH (ref 13.2–17.1)
MCH: 28.5 pg (ref 27.0–33.0)
MCHC: 33 g/dL (ref 32.0–36.0)
MCV: 86.3 fL (ref 80.0–100.0)
MPV: 9.8 fL (ref 7.5–12.5)
Monocytes Relative: 6.1 %
Neutro Abs: 6660 {cells}/uL (ref 1500–7800)
Neutrophils Relative %: 74 %
Platelets: 254 10*3/uL (ref 140–400)
RBC: 6.08 10*6/uL — ABNORMAL HIGH (ref 4.20–5.80)
RDW: 13.3 % (ref 11.0–15.0)
Total Lymphocyte: 17.4 %
WBC: 9 10*3/uL (ref 3.8–10.8)

## 2023-09-09 LAB — HSV(HERPES SIMPLEX VRS) I + II AB-IGG
HSV 1 IGG,TYPE SPECIFIC AB: 56.5 {index} — ABNORMAL HIGH
HSV 2 IGG,TYPE SPECIFIC AB: <0.9 {index}

## 2023-09-09 LAB — HIV ANTIBODY (ROUTINE TESTING W REFLEX): HIV 1&2 Ab, 4th Generation: NONREACTIVE

## 2023-09-09 LAB — LIPID PANEL
Cholesterol: 187 mg/dL (ref <200)
HDL: 46 mg/dL (ref >=40)
LDL Cholesterol (Calc): 105 mg/dL — ABNORMAL HIGH
Non-HDL Cholesterol (Calc): 141 mg/dL — ABNORMAL HIGH (ref <130)
Total CHOL/HDL Ratio: 4.1 (calc) (ref <5.0)
Triglycerides: 250 mg/dL — ABNORMAL HIGH (ref <150)

## 2023-09-09 LAB — HEPATITIS C ANTIBODY: Hepatitis C Ab: NONREACTIVE

## 2023-09-10 ENCOUNTER — Encounter: Payer: Self-pay | Admitting: Family Medicine

## 2023-09-10 MED ORDER — VALACYCLOVIR HCL 1 G PO TABS: 1000.0000 mg | ORAL_TABLET | Freq: Every day | ORAL | 0 refills | Status: DC

## 2023-09-10 NOTE — Addendum Note (Signed)
 Addended by: Park Meo on: 09/10/2023 08:04 AM   Modules accepted: Orders

## 2023-09-10 NOTE — Addendum Note (Signed)
 Addended by: Arta Silence on: 09/10/2023 04:02 PM   Modules accepted: Orders

## 2023-09-16 ENCOUNTER — Ambulatory Visit: Admitting: Family Medicine

## 2023-09-16 ENCOUNTER — Encounter (INDEPENDENT_AMBULATORY_CARE_PROVIDER_SITE_OTHER): Payer: Self-pay

## 2023-09-16 ENCOUNTER — Encounter: Payer: Self-pay | Admitting: Family Medicine

## 2023-09-16 VITALS — BP 148/100 | HR 70 | Temp 98.5°F | Ht 68.0 in | Wt 206.0 lb

## 2023-09-16 DIAGNOSIS — I10 Essential (primary) hypertension: Secondary | ICD-10-CM | POA: Insufficient documentation

## 2023-09-16 DIAGNOSIS — E782 Mixed hyperlipidemia: Secondary | ICD-10-CM | POA: Diagnosis not present

## 2023-09-16 MED ORDER — AMLODIPINE BESYLATE 5 MG PO TABS: 5.0000 mg | ORAL_TABLET | Freq: Every day | ORAL | 1 refills | Status: DC

## 2023-09-16 NOTE — Assessment & Plan Note (Signed)
 Uncontrolled. Start Amlodipine 5mg  daily.  Recommend heart healthy diet such as Mediterranean diet with whole grains, fruits, vegetable, fish, lean meats, nuts, and olive oil. Limit salt. Encouraged moderate walking, 3-5 times/week for 30-50 minutes each session. Aim for at least 150 minutes.week. Goal should be pace of 3 miles/hours, or walking 1.5 miles in 30 minutes. Avoid tobacco products. Avoid excess alcohol. Take medications as prescribed and bring medications and blood pressure log with cuff to each office visit. Seek medical care for chest pain, palpitations, shortness of breath with exertion, dizziness/lightheadedness, vision changes, recurrent headaches, or swelling of extremities. Follow up in 2-4 weeks.

## 2023-09-16 NOTE — Progress Notes (Signed)
 Subjective:  HPI: Timothy Bennett is a 43 y.o. male presenting on 09/16/2023 for Follow-up (Here for lab follow up. Pt states his mom passed away this week. )   HPI Patient is in today for blood pressure follow up and labs discussion. He does report increase in stress due to his mothers recent passing. Was previously prescribed Amlodipine but was tapered off when he was able to lose weight in the past.   HYPERTENSION / HYPERLIPIDEMIA Satisfied with current treatment? no Duration of hypertension: chronic BP monitoring frequency: not checking BP range:  BP medication side effects: no Past BP meds: amlodipine  Aspirin: no Recent stressors: yes Recurrent headaches: yes Visual changes: no Palpitations: no Dyspnea: no Chest pain: no Lower extremity edema: no Dizzy/lightheaded: no  The 10-year ASCVD risk score (Arnett DK, et al., 2019) is: 1.9%   Values used to calculate the score:     Age: 66 years     Sex: Male     Is Non-Hispanic African American: No     Diabetic: No     Tobacco smoker: No     Systolic Blood Pressure: 148 mmHg     Is BP treated: Yes     HDL Cholesterol: 46 mg/dL     Total Cholesterol: 187 mg/dL   Review of Systems  All other systems reviewed and are negative.   Relevant past medical history reviewed and updated as indicated.   Past Medical History:  Diagnosis Date   Gout    Hypertension      No past surgical history on file.  Allergies and medications reviewed and updated.   Current Outpatient Medications:    amLODipine (NORVASC) 5 MG tablet, Take 1 tablet (5 mg total) by mouth daily., Disp: 30 tablet, Rfl: 1   Cholecalciferol 1.25 MG (50000 UT) capsule, Take by mouth daily. (Patient not taking: Reported on 09/16/2023), Disp: , Rfl:    ibuprofen (ADVIL) 800 MG tablet, Take 1 tablet (800 mg total) by mouth 3 (three) times daily. (Patient not taking: Reported on 09/16/2023), Disp: 21 tablet, Rfl: 0   predniSONE (DELTASONE) 10 MG tablet, Take 1  tablet (10 mg total) by mouth daily. 6,5,4,3,2,1 six day taper (Patient not taking: Reported on 09/16/2023), Disp: 21 tablet, Rfl: 0   silver sulfADIAZINE (SILVADENE) 1 % cream, Apply to burn with dressing changes. (Patient not taking: Reported on 09/16/2023), Disp: 50 g, Rfl: 0  No Known Allergies  Objective:   BP (!) 148/100   Pulse 70   Temp 98.5 F (36.9 C)   Ht 5\' 8"  (1.727 m)   Wt 206 lb (93.4 kg)   SpO2 98%   BMI 31.32 kg/m      09/16/2023    9:11 AM 09/16/2023    8:47 AM 09/08/2023    8:07 AM  Vitals with BMI  Height  5\' 8"  5\' 8"   Weight  206 lbs 207 lbs  BMI  31.33 31.48  Systolic 148 152 161  Diastolic 100 108 80  Pulse 70 51 73     Physical Exam Vitals and nursing note reviewed.  Constitutional:      Appearance: Normal appearance. He is normal weight.  HENT:     Head: Normocephalic and atraumatic.  Cardiovascular:     Rate and Rhythm: Normal rate and regular rhythm.     Pulses: Normal pulses.     Heart sounds: Normal heart sounds.  Pulmonary:     Effort: Pulmonary effort is normal.     Breath  sounds: Normal breath sounds.  Skin:    General: Skin is warm and dry.     Capillary Refill: Capillary refill takes less than 2 seconds.  Neurological:     General: No focal deficit present.     Mental Status: He is alert and oriented to person, place, and time. Mental status is at baseline.  Psychiatric:        Mood and Affect: Mood normal.        Behavior: Behavior normal.        Thought Content: Thought content normal.        Judgment: Judgment normal.     Assessment & Plan:  Primary hypertension Assessment & Plan: Uncontrolled. Start Amlodipine 5mg  daily.  Recommend heart healthy diet such as Mediterranean diet with whole grains, fruits, vegetable, fish, lean meats, nuts, and olive oil. Limit salt. Encouraged moderate walking, 3-5 times/week for 30-50 minutes each session. Aim for at least 150 minutes.week. Goal should be pace of 3 miles/hours, or walking  1.5 miles in 30 minutes. Avoid tobacco products. Avoid excess alcohol. Take medications as prescribed and bring medications and blood pressure log with cuff to each office visit. Seek medical care for chest pain, palpitations, shortness of breath with exertion, dizziness/lightheadedness, vision changes, recurrent headaches, or swelling of extremities. Follow up in 2-4 weeks.   Moderate mixed hyperlipidemia not requiring statin therapy Assessment & Plan: Your labs showed elevated cholesterol. I recommend consuming a heart healthy diet such as Mediterranean diet or DASH diet with whole grains, fruits, vegetable, fish, lean meats, nuts, and olive oil. Limit sweets and processed foods. I also encourage moderate intensity exercise 150 minutes weekly. This is 3-5 times weekly for 30-50 minutes each session. Goal should be pace of 3 miles/hours, or walking 1.5 miles in 30 minutes. The 10-year ASCVD risk score (Arnett DK, et al., 2019) is: 1.9%    Other orders -     amLODIPine Besylate; Take 1 tablet (5 mg total) by mouth daily.  Dispense: 30 tablet; Refill: 1     Follow up plan: Return in about 3 weeks (around 10/07/2023) for hypertension.  Park Meo, FNP

## 2023-09-16 NOTE — Assessment & Plan Note (Signed)
 Your labs showed elevated cholesterol. I recommend consuming a heart healthy diet such as Mediterranean diet or DASH diet with whole grains, fruits, vegetable, fish, lean meats, nuts, and olive oil. Limit sweets and processed foods. I also encourage moderate intensity exercise 150 minutes weekly. This is 3-5 times weekly for 30-50 minutes each session. Goal should be pace of 3 miles/hours, or walking 1.5 miles in 30 minutes. The 10-year ASCVD risk score (Arnett DK, et al., 2019) is: 1.9%

## 2023-10-07 ENCOUNTER — Ambulatory Visit: Admitting: Family Medicine

## 2023-10-07 ENCOUNTER — Encounter: Payer: Self-pay | Admitting: Family Medicine

## 2023-10-07 VITALS — BP 140/102 | HR 63 | Temp 98.5°F | Ht 68.0 in | Wt 207.4 lb

## 2023-10-07 DIAGNOSIS — I10 Essential (primary) hypertension: Secondary | ICD-10-CM

## 2023-10-07 DIAGNOSIS — R0683 Snoring: Secondary | ICD-10-CM | POA: Insufficient documentation

## 2023-10-07 DIAGNOSIS — J3089 Other allergic rhinitis: Secondary | ICD-10-CM | POA: Insufficient documentation

## 2023-10-07 MED ORDER — AMLODIPINE BESYLATE 10 MG PO TABS: 10.0000 mg | ORAL_TABLET | Freq: Every day | ORAL | 1 refills | Status: DC

## 2023-10-07 NOTE — Progress Notes (Signed)
 Subjective:  HPI: Timothy Bennett is a 43 y.o. male presenting on 10/07/2023 for Follow-up (3 week follow up on blood pressure)   HPI Patient is in today for blood pressure follow up. BP elevated on initial visit to establish care with history of HTN previously on Amlodipine. DC when lifestyle modifications were effective. Restarted on Amlodipine 5mg  daily. STOP BANG 4 and he does snore and has frequent headaches. Increased stress recently.   HYPERTENSION without Chronic Kidney Disease Hypertension status: uncontrolled  Satisfied with current treatment? yes Duration of hypertension: chronic BP monitoring frequency:  rarely BP range:  BP medication side effects:  no Medication compliance: excellent compliance Previous BP meds: amlodipine Aspirin: no Recurrent headaches: yes Visual changes: no Palpitations: no Dyspnea: yes, attributed to pollen allergy Chest pain: no Lower extremity edema: no Dizzy/lightheaded: no   Review of Systems  All other systems reviewed and are negative.   Relevant past medical history reviewed and updated as indicated.   Past Medical History:  Diagnosis Date   Gout    Hypertension      No past surgical history on file.  Allergies and medications reviewed and updated.   Current Outpatient Medications:    amLODipine (NORVASC) 10 MG tablet, Take 1 tablet (10 mg total) by mouth daily., Disp: 90 tablet, Rfl: 1  No Known Allergies  Objective:   BP (!) 140/102 (BP Location: Left Arm, Patient Position: Sitting, Cuff Size: Normal)   Pulse 63   Temp 98.5 F (36.9 C)   Ht 5\' 8"  (1.727 m)   Wt 207 lb 6.4 oz (94.1 kg)   SpO2 98%   BMI 31.54 kg/m      10/07/2023    8:27 AM 10/07/2023    8:06 AM 09/16/2023    9:11 AM  Vitals with BMI  Height  5\' 8"    Weight  207 lbs 6 oz   BMI  31.54   Systolic 140 142 865  Diastolic 102 98 100  Pulse  63 70     Physical Exam Vitals and nursing note reviewed.  Constitutional:      Appearance: Normal  appearance. He is obese.  HENT:     Head: Normocephalic and atraumatic.  Cardiovascular:     Rate and Rhythm: Normal rate and regular rhythm.     Pulses: Normal pulses.     Heart sounds: Normal heart sounds.  Pulmonary:     Effort: Pulmonary effort is normal.     Breath sounds: Normal breath sounds.  Skin:    General: Skin is warm and dry.     Capillary Refill: Capillary refill takes less than 2 seconds.  Neurological:     General: No focal deficit present.     Mental Status: He is alert and oriented to person, place, and time. Mental status is at baseline.  Psychiatric:        Mood and Affect: Mood normal.        Behavior: Behavior normal.        Thought Content: Thought content normal.        Judgment: Judgment normal.     Assessment & Plan:  Primary hypertension Assessment & Plan: Uncontrolled. Increase Amlodipine to 10mg  daily. Advised to try to mitigate stressors. Sleep study ordered. Recommend heart healthy diet such as Mediterranean diet with whole grains, fruits, vegetable, fish, lean meats, nuts, and olive oil. Limit salt. Encouraged moderate walking, 3-5 times/week for 30-50 minutes each session. Aim for at least 150 minutes.week. Goal should be  pace of 3 miles/hours, or walking 1.5 miles in 30 minutes. Avoid tobacco products. Avoid excess alcohol. Take medications as prescribed and bring medications and blood pressure log with cuff to each office visit. Seek medical care for chest pain, palpitations, shortness of breath with exertion, dizziness/lightheadedness, vision changes, recurrent headaches, or swelling of extremities. Follow up in 1 month   Snoring Assessment & Plan: Stopbang 4. Sleep study ordered  Orders: -     Ambulatory referral to Sleep Studies  Other orders -     amLODIPine Besylate; Take 1 tablet (10 mg total) by mouth daily.  Dispense: 90 tablet; Refill: 1     Follow up plan: Return in about 3 weeks (around 10/28/2023).  Park Meo,  FNP

## 2023-10-07 NOTE — Assessment & Plan Note (Signed)
 Stopbang 4. Sleep study ordered

## 2023-10-07 NOTE — Assessment & Plan Note (Signed)
 Uncontrolled. Increase Amlodipine to 10mg  daily. Advised to try to mitigate stressors. Sleep study ordered. Recommend heart healthy diet such as Mediterranean diet with whole grains, fruits, vegetable, fish, lean meats, nuts, and olive oil. Limit salt. Encouraged moderate walking, 3-5 times/week for 30-50 minutes each session. Aim for at least 150 minutes.week. Goal should be pace of 3 miles/hours, or walking 1.5 miles in 30 minutes. Avoid tobacco products. Avoid excess alcohol. Take medications as prescribed and bring medications and blood pressure log with cuff to each office visit. Seek medical care for chest pain, palpitations, shortness of breath with exertion, dizziness/lightheadedness, vision changes, recurrent headaches, or swelling of extremities. Follow up in 1 month

## 2023-10-14 DIAGNOSIS — Z0100 Encounter for examination of eyes and vision without abnormal findings: Secondary | ICD-10-CM | POA: Diagnosis not present

## 2023-10-14 DIAGNOSIS — H5203 Hypermetropia, bilateral: Secondary | ICD-10-CM | POA: Diagnosis not present

## 2023-10-18 ENCOUNTER — Encounter: Payer: Self-pay | Admitting: Family Medicine

## 2023-10-19 ENCOUNTER — Other Ambulatory Visit: Payer: Self-pay | Admitting: Family Medicine

## 2023-10-19 MED ORDER — VALACYCLOVIR HCL 1 G PO TABS: 1000.0000 mg | ORAL_TABLET | Freq: Every day | ORAL | 0 refills | Status: AC

## 2023-10-20 ENCOUNTER — Ambulatory Visit (INDEPENDENT_AMBULATORY_CARE_PROVIDER_SITE_OTHER): Payer: Self-pay | Admitting: Internal Medicine

## 2023-10-20 ENCOUNTER — Encounter (INDEPENDENT_AMBULATORY_CARE_PROVIDER_SITE_OTHER): Payer: Self-pay | Admitting: Internal Medicine

## 2023-10-20 VITALS — BP 135/92 | HR 67 | Temp 98.1°F | Ht 68.0 in | Wt 201.0 lb

## 2023-10-20 DIAGNOSIS — I10 Essential (primary) hypertension: Secondary | ICD-10-CM

## 2023-10-20 DIAGNOSIS — D751 Secondary polycythemia: Secondary | ICD-10-CM | POA: Insufficient documentation

## 2023-10-20 DIAGNOSIS — E66811 Obesity, class 1: Secondary | ICD-10-CM | POA: Diagnosis not present

## 2023-10-20 DIAGNOSIS — K760 Fatty (change of) liver, not elsewhere classified: Secondary | ICD-10-CM | POA: Insufficient documentation

## 2023-10-20 DIAGNOSIS — E782 Mixed hyperlipidemia: Secondary | ICD-10-CM | POA: Diagnosis not present

## 2023-10-20 DIAGNOSIS — Z6831 Body mass index (BMI) 31.0-31.9, adult: Secondary | ICD-10-CM

## 2023-10-20 NOTE — Assessment & Plan Note (Signed)
 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 Weight loss of 10-15% is expected to improve the condition. - Incorporate weight loss and dietary changes as part of the weight management program to improve fatty liver disease. - Avoid alcohol consumption -Assess fibrosis score and other causes for hepatic steatosis at subsequent appointment

## 2023-10-20 NOTE — Assessment & Plan Note (Signed)
 His blood pressure is above goal at 135/92 he is currently on amlodipine 10 mg a day he is being evaluated for possible sleep apnea.  Patient advised on reducing sodium in his diet.  He had done a DASH diet in the past but felt it was too restrictive.  Losing 10% of body weight may improve blood pressure control.  At present he is going to work on reducing sodium in his diet.

## 2023-10-20 NOTE — Assessment & Plan Note (Signed)
 He has an increased RBC mass.  He is being evaluated for sleep apnea.  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  Hypoxemia needs to be excluded if this is unyielding then I recommend further testing for renal causes as he also has hypertension.  I will defer further testing and evaluation to primary care team.

## 2023-10-20 NOTE — Assessment & Plan Note (Signed)
 LDL is not at goal. Elevated LDL may be secondary to nutrition, genetics and spillover effect from excess adiposity. Recommended LDL goal is <70 to reduce the risk of fatty streaks and the progression to obstructive ASCVD in the future.   His 10 year risk is: The 10-year ASCVD risk score (Arnett DK, et al., 2019) is: 1.6%  Lab Results  Component Value Date   CHOL 187 09/08/2023   HDL 46 09/08/2023   LDLCALC 105 (H) 09/08/2023   TRIG 250 (H) 09/08/2023   CHOLHDL 4.1 09/08/2023    Continue weight loss therapy, losing 10% or more of body weight may improve condition. Also advised to reduce saturated fats in diet to less than 10% of daily calories.

## 2023-10-20 NOTE — Assessment & Plan Note (Signed)
 Timothy Bennett has a BMI of 30 with a body fat percentage of 22 and visceral fat rating of 10 which is not significantly above target.  His metabolic issues are out of proportion to amount of adipose tissue so he may have a genetic basis to his metabolic derangements.  He also tends to have central adiposity.  There are some metabolic conditions in his family.  We reviewed anthropometrics, biometrics, associated medical conditions and contributing factors with patient. he would benefit from a medically tailored reduced calorie nutrional plan based on his REE (resting energy expenditure), which will be determined by indirect calorimetry.  We will also assess for cardiometabolic risk and nutritional derangements via fasting labs at intake appointment.

## 2023-10-20 NOTE — Progress Notes (Signed)
 Office: [phone]  /  Fax: [phone]   Initial Visit  Timothy Bennett was seen in clinic today to evaluate for obesity. He is interested in losing weight to improve overall health and reduce the risk of weight related complications. He presents today to review program treatment options, initial physical assessment, and evaluation.     Discussed the use of AI scribe software for clinical note transcription with the patient, who gave verbal consent to proceed.    History of Present Illness   Timothy Bennett is a 43 year old male with hypertension and mixed hyperlipidemia who presents for introductory visit for medical weight management.  He has primary hypertension that has been difficult to control with current medication, experiencing persistent high blood pressure readings. He also has headaches and low energy levels, which he attributes to his weight.  He has mixed hyperlipidemia, with high triglycerides and borderline LDL cholesterol levels noted in labs from March. No prediabetes or diabetes, but his fasting blood sugar was 97, close to the prediabetes range.  He has a history of kidney stones and was found to have fatty liver disease on a CT scan from 2012, although he was not aware of the fatty liver diagnosis until now. No significant alcohol consumption both currently and at the time of the CT scan.  He is scheduled for a sleep study on April 29th due to suspected sleep apnea, as he reports snoring and the need for a fan while sleeping. He has an elevated blood count, which may be related to sleep apnea or other causes.  He does not smoke and has never smoked. He works as a Education administrator, which involves physical labor, and he reports that his lunch choices are not always healthy due to his work environment. He struggles with leaving food on his plate even when full.  Family history includes grandparents with diabetes and heart disease. His father does not frequently visit doctors, so  his health status is unclear. His mother had a 'horrible history' but has since passed away.         He was referred by: PCP  When asked what else they would like to accomplish? He states: Adopt healthier eating patterns, Improve energy levels and physical activity, Improve existing medical conditions, and Improve quality of life  When asked how has your weight affected you? He states: Contributed to medical problems, Having fatigue, and Having poor endurance  Weight history: Gradual as an adult  Some associated conditions: Hypertension, Hyperlipidemia, MASLD, and Other: Suspected OSA  Contributing factors: Family history of obesity and Consumption of processed foods  Weight promoting medications identified: None  Current nutrition plan: None  Current level of physical activity: Other: Plenty of physical labor he is a Education administrator  Current or previous pharmacotherapy: None  Response to medication: Never tried medications   Past medical history includes:   Past Medical History:  Diagnosis Date   Gout    Hypertension      Objective:   BP (!) 135/92   Pulse 67   Temp 98.1 F (36.7 C)   Ht 5\' 8"  (1.727 m)   Wt 201 lb (91.2 kg)   SpO2 96%   BMI 30.56 kg/m  He was weighed on the bioimpedance scale: Body mass index is 30.56 kg/m.  Peak Weight:211 , Body Fat%:22.5, Visceral Fat Rating:10, Weight trend over the last 12 months: Decreasing  General:  Alert, oriented and cooperative. Patient is in no acute distress.  Respiratory: Normal respiratory effort, no problems  with respiration noted   Gait: able to ambulate independently  Mental Status: Normal mood and affect. Normal behavior. Normal judgment and thought content.   DIAGNOSTIC DATA REVIEWED:  BMET    Component Value Date/Time   NA 141 09/08/2023 0845   K 4.1 09/08/2023 0845   CL 104 09/08/2023 0845   CO2 27 09/08/2023 0845   GLUCOSE 97 09/08/2023 0845   BUN 17 09/08/2023 0845   CREATININE 0.97 09/08/2023 0845    CALCIUM 9.4 09/08/2023 0845   GFRNONAA 49 (L) 11/21/2010 2111   GFRAA (L) 11/21/2010 2111    59        The eGFR has been calculated using the MDRD equation. This calculation has not been validated in all clinical situations. eGFR's persistently <60 mL/min signify possible Chronic Kidney Disease.   No results found for: "HGBA1C" No results found for: "INSULIN" CBC    Component Value Date/Time   WBC 9.0 09/08/2023 0845   RBC 6.08 (H) 09/08/2023 0845   HGB 17.3 (H) 09/08/2023 0845   HCT 52.5 (H) 09/08/2023 0845   PLT 254 09/08/2023 0845   MCV 86.3 09/08/2023 0845   MCH 28.5 09/08/2023 0845   MCHC 33.0 09/08/2023 0845   RDW 13.3 09/08/2023 0845   Iron/TIBC/Ferritin/ %Sat No results found for: "IRON", "TIBC", "FERRITIN", "IRONPCTSAT" Lipid Panel     Component Value Date/Time   CHOL 187 09/08/2023 0845   TRIG 250 (H) 09/08/2023 0845   HDL 46 09/08/2023 0845   CHOLHDL 4.1 09/08/2023 0845   LDLCALC 105 (H) 09/08/2023 0845   Hepatic Function Panel     Component Value Date/Time   PROT 7.0 09/08/2023 0845   ALBUMIN 4.2 11/21/2010 2111   AST 18 09/08/2023 0845   ALT 29 09/08/2023 0845   ALKPHOS 52 11/21/2010 2111   BILITOT 0.5 09/08/2023 0845   No results found for: "TSH"   Assessment and Plan:   Class 1 obesity with serious comorbidity and body mass index (BMI) of 31.0 to 31.9 in adult, unspecified obesity type Assessment & Plan: Timothy Bennett has a BMI of 30 with a body fat percentage of 22 and visceral fat rating of 10 which is not significantly above target.  His metabolic issues are out of proportion to amount of adipose tissue so he may have a genetic basis to his metabolic derangements.  He also tends to have central adiposity.  There are some metabolic conditions in his family.  We reviewed anthropometrics, biometrics, associated medical conditions and contributing factors with patient. he would benefit from a medically tailored reduced calorie nutrional plan based  on his REE (resting energy expenditure), which will be determined by indirect calorimetry.  We will also assess for cardiometabolic risk and nutritional derangements via fasting labs at intake appointment.     Primary hypertension Assessment & Plan: His blood pressure is above goal at 135/92 he is currently on amlodipine 10 mg a day he is being evaluated for possible sleep apnea.  Patient advised on reducing sodium in his diet.  He had done a DASH diet in the past but felt it was too restrictive.  Losing 10% of body weight may improve blood pressure control.  At present he is going to work on reducing sodium in his diet.   Moderate mixed hyperlipidemia not requiring statin therapy Assessment & Plan: LDL is not at goal. Elevated LDL may be secondary to nutrition, genetics and spillover effect from excess adiposity. Recommended LDL goal is <70 to reduce the risk of fatty  streaks and the progression to obstructive ASCVD in the future.   His 10 year risk is: The 10-year ASCVD risk score (Arnett DK, et al., 2019) is: 1.6%  Lab Results  Component Value Date   CHOL 187 09/08/2023   HDL 46 09/08/2023   LDLCALC 105 (H) 09/08/2023   TRIG 250 (H) 09/08/2023   CHOLHDL 4.1 09/08/2023    Continue weight loss therapy, losing 10% or more of body weight may improve condition. Also advised to reduce saturated fats in diet to less than 10% of daily calories.        Polycythemia Assessment & Plan: He has an increased RBC mass.  He is being evaluated for sleep apnea.  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  Hypoxemia needs to be excluded if this is unyielding then I recommend further testing for renal causes as he also has hypertension.  I will defer further testing and evaluation to primary care team.   Metabolic dysfunction-associated steatotic liver disease (MASLD) Assessment & Plan: Timothy Bennett has MASLD, identified  on a CT scan from 2012, with normal liver enzymes currently. Weight loss of 10-15% is expected to improve the condition. - Incorporate weight loss and dietary changes as part of the weight management program to improve fatty liver disease. - Avoid alcohol consumption -Assess fibrosis score and other causes for hepatic steatosis at subsequent appointment       General Health Maintenance Focus on lifestyle modifications to improve overall health, including nutrition, physical activity, and cardiovascular health. Emphasize hydration and monitor for vitamin deficiencies. Testing for insulin resistance and vitamin deficiencies is planned. - Advise on maintaining adequate hydration, especially before blood tests. - Consider testing for vitamin deficiencies, including B12 and vitamin D, during the next visit. - Provide counseling on lifestyle changes, including physical activity and sleep.  Follow-up Timothy Bennett is considering enrolling in the weight management program and plans to discuss it with his wife. The program includes a one-time fee and requir es completion of a health questionnaire and fasting before the next visit. - Provide information on the weight management program, i - Advise Timothy Bennett to contact the clinic to enroll in the program after discussing with his wife. - Schedule the second and third visits once enrolled, ensuring completion of the health questionnaire and fasting requirements.          Obesity Treatment / Action Plan:  Patient will work on garnering support from family and friends to begin weight loss journey. Will work on eliminating or reducing the presence of highly palatable, calorie dense foods in the home. Will complete provided nutritional and psychosocial assessment questionnaire before the next appointment. Will be scheduled for indirect calorimetry to determine resting energy expenditure in a fasting state.  This will allow [place] to create a reduced calorie,  high-protein meal plan to promote loss of fat mass while preserving muscle mass. Counseled on the health benefits of losing 5%-15% of total body weight. Was counseled on nutritional approaches to weight loss and benefits of reducing processed foods and consuming plant-based foods and high quality protein as part of nutritional weight management. Was counseled on pharmacotherapy and role as an adjunct in weight management.   Obesity Education Performed Today:  He was weighed on the bioimpedance scale and results were discussed and documented in the synopsis.  We discussed obesity as a disease and the importance of a more detailed evaluation of all the factors  contributing to the disease.  We discussed the importance of long term lifestyle changes which include nutrition, exercise and behavioral modifications as well as the importance of customizing this to his specific health and social needs.  We discussed the benefits of reaching a healthier weight to alleviate the symptoms of existing conditions and reduce the risks of the biomechanical, metabolic and psychological effects of obesity.  Timothy Bennett appears to be in the action stage of change and states they are ready to start intensive lifestyle modifications and behavioral modifications.  I have spent 40 minutes in the care of the patient today including: 2 minutes before the visit reviewing the chart 28 minutes face-to-face assessing and reviewing listed medical problems as outlined in obesity care plan, providing nutritional and behavioral counseling as outlined in obesity care plan, independently interpreting results and goals of care, see listed medical problems, and discussing biometric information and progress 10 minutes after the visit on documentation    Reviewed by clinician on day of visit: allergies, medications, problem list, medical history, surgical history, family history, social history, and previous encounter notes  pertinent to obesity diagnosis.   Ladd Picker, MD

## 2023-10-26 ENCOUNTER — Encounter: Payer: Self-pay | Admitting: Family Medicine

## 2023-11-03 ENCOUNTER — Other Ambulatory Visit: Payer: Self-pay | Admitting: Family Medicine

## 2023-11-03 ENCOUNTER — Encounter: Payer: Self-pay | Admitting: Family Medicine

## 2023-11-03 ENCOUNTER — Ambulatory Visit: Admitting: Family Medicine

## 2023-11-03 ENCOUNTER — Institutional Professional Consult (permissible substitution): Admitting: Neurology

## 2023-11-03 VITALS — BP 136/100 | HR 64 | Temp 97.8°F | Ht 68.0 in | Wt 206.2 lb

## 2023-11-03 DIAGNOSIS — G8929 Other chronic pain: Secondary | ICD-10-CM | POA: Insufficient documentation

## 2023-11-03 DIAGNOSIS — M5441 Lumbago with sciatica, right side: Secondary | ICD-10-CM | POA: Diagnosis not present

## 2023-11-03 DIAGNOSIS — E782 Mixed hyperlipidemia: Secondary | ICD-10-CM

## 2023-11-03 DIAGNOSIS — M5442 Lumbago with sciatica, left side: Secondary | ICD-10-CM

## 2023-11-03 DIAGNOSIS — I10 Essential (primary) hypertension: Secondary | ICD-10-CM | POA: Diagnosis not present

## 2023-11-03 MED ORDER — LISINOPRIL 10 MG PO TABS: 10.0000 mg | ORAL_TABLET | Freq: Every day | ORAL | 3 refills | Status: DC

## 2023-11-03 MED ORDER — PREDNISONE 20 MG PO TABS: ORAL_TABLET | ORAL | 0 refills | Status: DC

## 2023-11-03 MED ORDER — ZEPBOUND 2.5 MG/0.5ML ~~LOC~~ SOAJ: 2.5000 mg | SUBCUTANEOUS | 0 refills | Status: DC

## 2023-11-03 NOTE — Progress Notes (Signed)
 Subjective:  HPI: Timothy Bennett is a 43 y.o. male presenting on 11/03/2023 for Hypertension (Follow up) and Back Pain   Hypertension  Back Pain   Patient is in today for blood pressure follow up. Is not taking Amlodipine  due to sexual side effects. BP at home has been 132/94-170/88. Had not tried other medications in the past. Other concerns include chronic low back pain flare up. Pain is midline and does radiate bilaterally. Denies trauma or injury, fall, saddle numbness, fecal or urinary incontinence. Declines imaging. NSAIDs ineffective.  HYPERTENSION without Chronic Kidney Disease Hypertension status: uncontrolled  Satisfied with current treatment? no Duration of hypertension: months BP monitoring frequency:  daily BP range: 132/94-170/88 BP medication side effects:  yes Medication compliance: poor compliance Previous BP meds: amlodipine  Aspirin: no Recurrent headaches: no Visual changes: no Palpitations: no Dyspnea: no Chest pain: no Lower extremity edema: no Dizzy/lightheaded: no   Review of Systems  Musculoskeletal:  Positive for back pain.  All other systems reviewed and are negative.   Relevant past medical history reviewed and updated as indicated.   Past Medical History:  Diagnosis Date   Gout    Hypertension      History reviewed. No pertinent surgical history.  Allergies and medications reviewed and updated.   Current Outpatient Medications:    lisinopril (ZESTRIL) 10 MG tablet, Take 1 tablet (10 mg total) by mouth daily., Disp: 90 tablet, Rfl: 3   predniSONE  (DELTASONE ) 20 MG tablet, 3 tabs poqday 1-2, 2 tabs poqday 3-4, 1 tab poqday 5-6, Disp: 12 tablet, Rfl: 0   tirzepatide (ZEPBOUND) 2.5 MG/0.5ML Pen, Inject 2.5 mg into the skin once a week., Disp: 2 mL, Rfl: 0  No Known Allergies  Objective:   BP (!) 136/100 Comment: Manual-in office reading  Pulse 64   Temp 97.8 F (36.6 C) (Oral)   Ht 5\' 8"  (1.727 m)   Wt 206 lb 4 oz (93.6 kg)    SpO2 97%   BMI 31.36 kg/m      11/03/2023    4:11 PM 11/03/2023    4:07 PM 10/20/2023    8:35 AM  Vitals with BMI  Height  5\' 8"    Weight  206 lbs 4 oz   BMI  31.37   Systolic 136 158 161  Diastolic 100 109 92  Pulse  64      Physical Exam Vitals and nursing note reviewed.  Constitutional:      Appearance: Normal appearance. He is normal weight.  HENT:     Head: Normocephalic and atraumatic.  Cardiovascular:     Rate and Rhythm: Normal rate and regular rhythm.     Pulses: Normal pulses.     Heart sounds: Normal heart sounds.  Pulmonary:     Effort: Pulmonary effort is normal.     Breath sounds: Normal breath sounds.  Musculoskeletal:     Lumbar back: Normal. No bony tenderness.  Skin:    General: Skin is warm and dry.     Capillary Refill: Capillary refill takes less than 2 seconds.  Neurological:     General: No focal deficit present.     Mental Status: He is alert and oriented to person, place, and time. Mental status is at baseline.  Psychiatric:        Mood and Affect: Mood normal.        Behavior: Behavior normal.        Thought Content: Thought content normal.        Judgment:  Judgment normal.     Assessment & Plan:  Hypertension, unspecified type Assessment & Plan: Stop amlodipine  due to sexual side effects, start lisinopril 10mg  daily. Continue to monitor. Recommend heart healthy diet such as Mediterranean diet with whole grains, fruits, vegetable, fish, lean meats, nuts, and olive oil. Limit salt. Encouraged moderate walking, 3-5 times/week for 30-50 minutes each session. Aim for at least 150 minutes.week. Goal should be pace of 3 miles/hours, or walking 1.5 miles in 30 minutes. Avoid tobacco products. Avoid excess alcohol. Take medications as prescribed and bring medications and blood pressure log with cuff to each office visit. Seek medical care for chest pain, palpitations, shortness of breath with exertion, dizziness/lightheadedness, vision changes,  recurrent headaches, or swelling of extremities. Follow up in 3 weeks with labs week prior  Orders: -     Comprehensive metabolic panel with GFR; Future -     Lipid panel; Future  Chronic midline low back pain with bilateral sciatica Assessment & Plan: No red flags. Continue NSAIDs PRN. Start pred pak. Return to office PRN   Moderate mixed hyperlipidemia not requiring statin therapy Assessment & Plan: Your labs showed elevated cholesterol. I recommend consuming a heart healthy diet such as Mediterranean diet or DASH diet with whole grains, fruits, vegetable, fish, lean meats, nuts, and olive oil. Limit sweets and processed foods. I also encourage moderate intensity exercise 150 minutes weekly. This is 3-5 times weekly for 30-50 minutes each session. Goal should be pace of 3 miles/hours, or walking 1.5 miles in 30 minutes. The 10-year ASCVD risk score (Arnett DK, et al., 2019) is: 1.6%    Other orders -     Lisinopril; Take 1 tablet (10 mg total) by mouth daily.  Dispense: 90 tablet; Refill: 3 -     Zepbound; Inject 2.5 mg into the skin once a week.  Dispense: 2 mL; Refill: 0 -     predniSONE ; 3 tabs poqday 1-2, 2 tabs poqday 3-4, 1 tab poqday 5-6  Dispense: 12 tablet; Refill: 0     Follow up plan: Return in about 3 weeks (around 11/24/2023) for hypertension, chronic follow-up with labs 1 week prior.  Jenelle Mis, FNP

## 2023-11-03 NOTE — Assessment & Plan Note (Addendum)
 Stop amlodipine  due to sexual side effects, start lisinopril 10mg  daily. Continue to monitor. Recommend heart healthy diet such as Mediterranean diet with whole grains, fruits, vegetable, fish, lean meats, nuts, and olive oil. Limit salt. Encouraged moderate walking, 3-5 times/week for 30-50 minutes each session. Aim for at least 150 minutes.week. Goal should be pace of 3 miles/hours, or walking 1.5 miles in 30 minutes. Avoid tobacco products. Avoid excess alcohol. Take medications as prescribed and bring medications and blood pressure log with cuff to each office visit. Seek medical care for chest pain, palpitations, shortness of breath with exertion, dizziness/lightheadedness, vision changes, recurrent headaches, or swelling of extremities. Follow up in 3 weeks with labs week prior

## 2023-11-03 NOTE — Assessment & Plan Note (Signed)
 No red flags. Continue NSAIDs PRN. Start pred pak. Return to office PRN

## 2023-11-03 NOTE — Assessment & Plan Note (Signed)
 Your labs showed elevated cholesterol. I recommend consuming a heart healthy diet such as Mediterranean diet or DASH diet with whole grains, fruits, vegetable, fish, lean meats, nuts, and olive oil. Limit sweets and processed foods. I also encourage moderate intensity exercise 150 minutes weekly. This is 3-5 times weekly for 30-50 minutes each session. Goal should be pace of 3 miles/hours, or walking 1.5 miles in 30 minutes. The 10-year ASCVD risk score (Arnett DK, et al., 2019) is: 1.6%

## 2023-11-17 ENCOUNTER — Other Ambulatory Visit

## 2023-11-17 DIAGNOSIS — I10 Essential (primary) hypertension: Secondary | ICD-10-CM

## 2023-11-17 LAB — COMPREHENSIVE METABOLIC PANEL WITH GFR
AG Ratio: 1.8 (calc) (ref 1.0–2.5)
ALT: 29 U/L (ref 9–46)
AST: 18 U/L (ref 10–40)
Albumin: 4.4 g/dL (ref 3.6–5.1)
Alkaline phosphatase (APISO): 46 U/L (ref 36–130)
BUN: 18 mg/dL (ref 7–25)
CO2: 27 mmol/L (ref 20–32)
Calcium: 9.3 mg/dL (ref 8.6–10.3)
Chloride: 105 mmol/L (ref 98–110)
Creat: 1.07 mg/dL (ref 0.60–1.29)
Globulin: 2.4 g/dL (ref 1.9–3.7)
Glucose, Bld: 96 mg/dL (ref 65–99)
Potassium: 4.4 mmol/L (ref 3.5–5.3)
Sodium: 140 mmol/L (ref 135–146)
Total Bilirubin: 0.6 mg/dL (ref 0.2–1.2)
Total Protein: 6.8 g/dL (ref 6.1–8.1)
eGFR: 89 mL/min/{1.73_m2} (ref >=60)

## 2023-11-17 LAB — LIPID PANEL
Cholesterol: 203 mg/dL — ABNORMAL HIGH (ref <200)
HDL: 47 mg/dL (ref >=40)
LDL Cholesterol (Calc): 119 mg/dL — ABNORMAL HIGH
Non-HDL Cholesterol (Calc): 156 mg/dL — ABNORMAL HIGH (ref <130)
Total CHOL/HDL Ratio: 4.3 (calc) (ref <5.0)
Triglycerides: 246 mg/dL — ABNORMAL HIGH (ref <150)

## 2023-11-23 ENCOUNTER — Other Ambulatory Visit (HOSPITAL_COMMUNITY): Payer: Self-pay

## 2023-11-24 ENCOUNTER — Ambulatory Visit: Admitting: Family Medicine

## 2023-11-24 ENCOUNTER — Encounter: Payer: Self-pay | Admitting: Family Medicine

## 2023-11-24 ENCOUNTER — Telehealth: Payer: Self-pay | Admitting: Pharmacy Technician

## 2023-11-24 VITALS — BP 130/84 | HR 95 | Ht 68.0 in | Wt 207.2 lb

## 2023-11-24 DIAGNOSIS — Z6831 Body mass index (BMI) 31.0-31.9, adult: Secondary | ICD-10-CM

## 2023-11-24 DIAGNOSIS — E66811 Obesity, class 1: Secondary | ICD-10-CM | POA: Diagnosis not present

## 2023-11-24 DIAGNOSIS — I10 Essential (primary) hypertension: Secondary | ICD-10-CM

## 2023-11-24 DIAGNOSIS — E782 Mixed hyperlipidemia: Secondary | ICD-10-CM | POA: Diagnosis not present

## 2023-11-24 NOTE — Telephone Encounter (Signed)
 Pharmacy Patient Advocate Encounter   Received notification from Patient Advice Request messages that prior authorization for Zepbound  2.5MG /0.5ML pen-injectors is required/requested.   Insurance verification completed.   The patient is insured through [facility] .   Per test claim: PA required; PA submitted to above mentioned insurance via CoverMyMeds Key/confirmation #/EOC [id number] Status is pending

## 2023-11-24 NOTE — Progress Notes (Signed)
 Subjective:  HPI: Timothy Bennett is a 43 y.o. male presenting on 11/24/2023 for No chief complaint on file.   HPI Patient is in today for HTN and HLD follow up. Has started Lisinopril  10mg  daily. Has not yet received Zepbound , waiting for PA. Continuing to work on diet and exercise.   HYPERTENSION without Chronic Kidney Disease Hypertension status: better  Satisfied with current treatment? yes Duration of hypertension: chronic BP monitoring frequency:  a few times a day BP range: <140/90 BP medication side effects:  no Medication compliance: excellent compliance Previous BP meds: amlodipine , lisinopril  Aspirin: no Recurrent headaches: no Visual changes: no Palpitations: no Dyspnea: no Chest pain: no Lower extremity edema: no Dizzy/lightheaded: no  The 10-year ASCVD risk score (Arnett DK, et al., 2019) is: 1.7%   Values used to calculate the score:     Age: 22 years     Sex: Male     Is Non-Hispanic African American: No     Diabetic: No     Tobacco smoker: No     Systolic Blood Pressure: 130 mmHg     Is BP treated: Yes     HDL Cholesterol: 47 mg/dL     Total Cholesterol: 203 mg/dL    Review of Systems  All other systems reviewed and are negative.   Relevant past medical history reviewed and updated as indicated.   Past Medical History:  Diagnosis Date   Gout    Hypertension      History reviewed. No pertinent surgical history.  Allergies and medications reviewed and updated.   Current Outpatient Medications:    lisinopril  (ZESTRIL ) 10 MG tablet, Take 1 tablet (10 mg total) by mouth daily., Disp: 90 tablet, Rfl: 3   predniSONE  (DELTASONE ) 20 MG tablet, 3 tabs poqday 1-2, 2 tabs poqday 3-4, 1 tab poqday 5-6 (Patient not taking: Reported on 11/24/2023), Disp: 12 tablet, Rfl: 0   tirzepatide  (ZEPBOUND ) 2.5 MG/0.5ML Pen, Inject 2.5 mg into the skin once a week. (Patient not taking: Reported on 11/24/2023), Disp: 2 mL, Rfl: 0  No Known Allergies  Objective:    BP 130/84   Pulse 95   Ht 5\' 8"  (1.727 m)   Wt 207 lb 4 oz (94 kg)   SpO2 96%   BMI 31.51 kg/m      11/24/2023    2:28 PM 11/03/2023    4:11 PM 11/03/2023    4:07 PM  Vitals with BMI  Height 5\' 8"   5\' 8"   Weight 207 lbs 4 oz  206 lbs 4 oz  BMI 31.52  31.37  Systolic 130 136 161  Diastolic 84 100 109  Pulse 95  64     Physical Exam Vitals and nursing note reviewed.  Constitutional:      Appearance: Normal appearance. He is normal weight.  HENT:     Head: Normocephalic and atraumatic.  Cardiovascular:     Rate and Rhythm: Normal rate and regular rhythm.     Pulses: Normal pulses.     Heart sounds: Normal heart sounds.  Pulmonary:     Effort: Pulmonary effort is normal.     Breath sounds: Normal breath sounds.  Skin:    General: Skin is warm and dry.     Capillary Refill: Capillary refill takes less than 2 seconds.  Neurological:     General: No focal deficit present.     Mental Status: He is alert and oriented to person, place, and time. Mental status is at baseline.  Psychiatric:  Mood and Affect: Mood normal.        Behavior: Behavior normal.        Thought Content: Thought content normal.        Judgment: Judgment normal.     Assessment & Plan:  Primary hypertension Assessment & Plan: BP well controlled on Lisinopril  10mg  daily. Will continue to monitor at home. Awaiting sleep study. Recommend heart healthy diet such as Mediterranean diet with whole grains, fruits, vegetable, fish, lean meats, nuts, and olive oil. Limit salt. Encouraged moderate walking, 3-5 times/week for 30-50 minutes each session. Aim for at least 150 minutes.week. Goal should be pace of 3 miles/hours, or walking 1.5 miles in 30 minutes. Avoid tobacco products. Avoid excess alcohol. Take medications as prescribed and bring medications and blood pressure log with cuff to each office visit. Seek medical care for chest pain, palpitations, shortness of breath with exertion,  dizziness/lightheadedness, vision changes, recurrent headaches, or swelling of extremities. Follow up in 3 months   Moderate mixed hyperlipidemia not requiring statin therapy Assessment & Plan: Cholesterol remains elevated. I recommend consuming a heart healthy diet such as Mediterranean diet or DASH diet with whole grains, fruits, vegetable, fish, lean meats, nuts, and olive oil. Limit sweets and processed foods. I also encourage moderate intensity exercise 150 minutes weekly. This is 3-5 times weekly for 30-50 minutes each session. Goal should be pace of 3 miles/hours, or walking 1.5 miles in 30 minutes. The 10-year ASCVD risk score (Arnett DK, et al., 2019) is: 1.7%    Class 1 obesity with serious comorbidity and body mass index (BMI) of 31.0 to 31.9 in adult, unspecified obesity type Assessment & Plan: Working on getting Zepbound  through insurance. Encourage low calorie, heart healthy diet and moderate intensity exercise 150 minutes weekly. This is 3-5 times weekly for 30-50 minutes each session. Goal should be pace of 3 miles/hours, or walking 1.5 miles in 30 minutes and include strength training. Has met with [facility].      Follow up plan: Return in about 3 months (around 02/24/2024) for hypertension.  Jenelle Mis, FNP

## 2023-11-24 NOTE — Telephone Encounter (Signed)
 PA request has been Started. New Encounter has been or will be created for follow up. For additional info see Pharmacy Prior Auth telephone encounter from 11/24/2023.

## 2023-11-25 ENCOUNTER — Encounter: Payer: Self-pay | Admitting: Neurology

## 2023-11-25 ENCOUNTER — Encounter: Payer: Self-pay | Admitting: Family Medicine

## 2023-11-25 ENCOUNTER — Ambulatory Visit: Admitting: Neurology

## 2023-11-25 VITALS — BP 132/98 | HR 89 | Ht 68.0 in | Wt 207.0 lb

## 2023-11-25 DIAGNOSIS — Z6831 Body mass index (BMI) 31.0-31.9, adult: Secondary | ICD-10-CM | POA: Diagnosis not present

## 2023-11-25 DIAGNOSIS — K760 Fatty (change of) liver, not elsewhere classified: Secondary | ICD-10-CM | POA: Diagnosis not present

## 2023-11-25 DIAGNOSIS — T560X1D Toxic effect of lead and its compounds, accidental (unintentional), subsequent encounter: Secondary | ICD-10-CM | POA: Diagnosis not present

## 2023-11-25 DIAGNOSIS — D751 Secondary polycythemia: Secondary | ICD-10-CM

## 2023-11-25 DIAGNOSIS — J3089 Other allergic rhinitis: Secondary | ICD-10-CM

## 2023-11-25 DIAGNOSIS — M1A1720 Lead-induced chronic gout, left ankle and foot, without tophus (tophi): Secondary | ICD-10-CM | POA: Diagnosis not present

## 2023-11-25 DIAGNOSIS — E6609 Other obesity due to excess calories: Secondary | ICD-10-CM

## 2023-11-25 DIAGNOSIS — E66811 Obesity, class 1: Secondary | ICD-10-CM

## 2023-11-25 NOTE — Progress Notes (Signed)
 SLEEP MEDICINE CLINIC    Provider:  Neomia Banner, MD  Primary Care Physician:  Jenelle Mis, FNP [address] [place] [address]     Referring Provider: Jenelle Mis, Fnp [address] [place],  [place] [address]   Dr.  Allie Area.          Chief Complaint according to patient   Patient presents with:     New Patient (Initial Visit)           HISTORY OF PRESENT ILLNESS:  Timothy Bennett is a 43 y.o. male patient who is seen upon PCP referral on 11/25/2023 and upon request of his bariatric MD,  for a sleep evaluation. He gained 50 pounds over the last 20 years, but in 2019 he was able to lose 30 pounds and gained it back under the pandemic restriction-  he had lost weight by diet adjustment alone.  Chief concern according to patient :  " my PCP would like for me to go on Mounjaro- Zepbound  ".      Sleep relevant medical history: Nocturia 3-5,times - Sleep walking, into adult hood.  ENT : chronic sinus congestion- no Tonsillectomy, no nasal septal deviation,  seasonal allergies, and cats/ dogs.  He has had extensive allergy testing at [facility] allergy.  Gout,  HTN,    Family medical /sleep history: no other family member on CPAP with OSA.  Social history: Patient is working as Education administrator , and all allergies started when he chose to became a Education administrator- He lives in a household with spouse with 3 children, ( 21- 14 ).  The patient currently works.  Pet: daughter a rabbit.  Tobacco use; none   ETOH use ; social - 2-4 drinks a month,  Caffeine intake in form of Coffee( /) Soda( coke 1 /dy) Tea ( /) or energy drinks Exercise in form of physically active at work.   Osteoarthritis in both knees.       Sleep habits are as follows: The patient's dinner time is between 6 PM. The patient goes to bed at 10.30 PM and continues to sleep for 7 hours, wakes for up to 5 bathroom breaks, the first time at 2 AM.   The preferred sleep position is variable , prone to start-, with  the support of 1 pillow. No GERD. Dreams are reportedly frequent/vivid.   The patient wakes up with an alarm. First ring set at 4 AM and 4.15   AM is the usual rise time. He reports not feeling refreshed or restored in AM, with symptoms such as dry mouth, morning headaches, and residual fatigue.  Naps are taken infrequently, l   Review of Systems: Out of a complete 14 system review, the patient complains of only the following symptoms, and all other reviewed systems are negative.:  Fatigue, sleepiness , snoring, fragmented sleep, RLS with back pain-, Nocturia    How likely are you to doze in the following situations: 0 = not likely, 1 = slight chance, 2 = moderate chance, 3 = high chance   Sitting and Reading? Watching Television? Sitting inactive in a public place (theater or meeting)? As a passenger in a car for an hour without a break? Lying down in the afternoon when circumstances permit? Sitting and talking to someone? Sitting quietly after lunch without alcohol? In a car, while stopped for a few minutes in traffic?   Total = 4/ 24 points   FSS endorsed at  26/ 63 points.   Social History   Socioeconomic History   Marital status: Married . Lived most of his life in [place]     Spouse name: Not on file   Number of children: 3   Years of education: [place]  HS-    Highest education level: GED or equivalent  Occupational History    Proofreader   Tobacco Use   Smoking status: Never   Smokeless tobacco: Never  Vaping Use   Vaping status: Never Used  Substance and Sexual Activity   Alcohol use: Not Currently   Drug use: Never   Sexual activity: Yes  Other Topics Concern   Not on file  Social History Narrative   Not on file   Social Drivers of Health   Financial Resource Strain: Low Risk  (09/07/2023)   Overall Financial Resource Strain (CARDIA)    Difficulty of Paying Living Expenses: Not very hard  Food Insecurity: No Food Insecurity (09/07/2023)   Hunger Vital  Sign    Worried About Running Out of Food in the Last Year: Never true    Ran Out of Food in the Last Year: Never true  Transportation Needs: No Transportation Needs (09/07/2023)   PRAPARE - Administrator, Civil Service (Medical): No    Lack of Transportation (Non-Medical): No  Physical Activity: Insufficiently Active (09/07/2023)   Exercise Vital Sign    Days of Exercise per Week: 3 days    Minutes of Exercise per Session: 20 min  Stress: Stress Concern Present (09/07/2023)   [employer] of Occupational Health - Occupational Stress Questionnaire    Feeling of Stress : To some extent  Social Connections: Moderately Isolated (09/07/2023)   Social Connection and Isolation Panel [NHANES]    Frequency of Communication with Friends and Family: Twice a week    Frequency of Social Gatherings with Friends and Family: Twice a week    Attends Religious Services: Never    Database administrator or Organizations: No    Attends Engineer, structural: Not on file    Marital Status: Married    Family History  Problem Relation Age of Onset   Sleep apnea Mother     Past Medical History:  Diagnosis Date   Gout    Hypertension     History reviewed. No pertinent surgical history.   Current Outpatient Medications on File Prior to Visit  Medication Sig Dispense Refill   lisinopril  (ZESTRIL ) 10 MG tablet Take 1 tablet (10 mg total) by mouth daily. 90 tablet 3   No current facility-administered medications on file prior to visit.    No Known Allergies   DIAGNOSTIC DATA (LABS, IMAGING, TESTING) - I reviewed patient records, labs, notes, testing and imaging myself where available.  Lab Results  Component Value Date   WBC 9.0 09/08/2023   HGB 17.3 (H) 09/08/2023   HCT 52.5 (H) 09/08/2023   MCV 86.3 09/08/2023   PLT 254 09/08/2023      Component Value Date/Time   NA 140 11/17/2023 0824   K 4.4 11/17/2023 0824   CL 105 11/17/2023 0824   CO2 27 11/17/2023 0824    GLUCOSE 96 11/17/2023 0824   BUN 18 11/17/2023 0824   CREATININE 1.07 11/17/2023 0824   CALCIUM 9.3 11/17/2023 0824   PROT 6.8 11/17/2023 0824   ALBUMIN 4.2 11/21/2010 2111   AST 18 11/17/2023 0824   ALT 29 11/17/2023 0824   ALKPHOS 52 11/21/2010 2111   BILITOT  0.6 11/17/2023 0824   GFRNONAA 49 (L) 11/21/2010 2111   GFRAA (L) 11/21/2010 2111    59        The eGFR has been calculated using the MDRD equation. This calculation has not been validated in all clinical situations. eGFR's persistently <60 mL/min signify possible Chronic Kidney Disease.   Lab Results  Component Value Date   CHOL 203 (H) 11/17/2023   HDL 47 11/17/2023   LDLCALC 119 (H) 11/17/2023   TRIG 246 (H) 11/17/2023   CHOLHDL 4.3 11/17/2023   No results found for: "HGBA1C" No results found for: "VITAMINB12" No results found for: "TSH"  PHYSICAL EXAM:  Today's Vitals   11/25/23 1444  BP: (!) 132/98  Pulse: 89  Weight: 207 lb (93.9 kg)  Height: 5\' 8"  (1.727 m)   Body mass index is 31.47 kg/m.   Wt Readings from Last 3 Encounters:  11/25/23 207 lb (93.9 kg)  11/24/23 207 lb 4 oz (94 kg)  11/03/23 206 lb 4 oz (93.6 kg)     Ht Readings from Last 3 Encounters:  11/25/23 5\' 8"  (1.727 m)  11/24/23 5\' 8"  (1.727 m)  11/03/23 5\' 8"  (1.727 m)      General: The patient is awake, alert and appears not in acute distress. The patient is well groomed. Head: Normocephalic, atraumatic. Neck is supple.  Mallampati 3 plus (4),  neck circumference:17. 5  inches . Nasal airflow congested. No overbite  Dental status: biological  Cardiovascular:  Regular rate and cardiac rhythm by pulse,  without distended neck veins. Respiratory: Lungs are clear to auscultation.  Skin:  Without evidence of ankle edema,- has a history of gout.  Trunk: The patient's posture is erect.   NEUROLOGIC EXAM: The patient is awake and alert, oriented to place and time.   Memory subjective described as intact.  Attention span &  concentration ability appears normal.  Speech is fluent,  without  dysarthria, dysphonia or aphasia.  Mood and affect are appropriate.   Cranial nerves: no loss of smell or taste reported  Pupils are equal and briskly reactive to light. Funduscopic exam deferred. .  Extraocular movements in vertical and horizontal planes were intact and without nystagmus. No Diplopia. Visual fields by finger perimetry are intact. Hearing was intact to soft voice and finger rubbing.    Facial motor strength is symmetric and tongue and uvula move midline.  Neck ROM : rotation, tilt and flexion extension were normal for age and shoulder shrug was symmetrical.    Motor exam:  Symmetric bulk, tone and ROM.   Normal tone without cog -wheeling, symmetric grip strength .   Sensory:  Fine touch,  vibration were intact . Proprioception tested in the upper extremities was normal.   Coordination: no ataxia, dysmetria or tremor.   Gait and station: Patient could rise unassisted from a seated position, walked without assistive device.  Stance is of normal width/ base  Toe and heel walk were deferred.  Deep tendon reflexes: in the  upper and lower extremities are symmetric and intact.      ASSESSMENT AND PLAN 43 y.o. year old male  here with:    1) obesity and restless sleeper,  polycythemia-   here to be screened for OSA , the dx is helpful in obtaining Zepbound .  Also nocturnal hypoxia may explain polycythemia .  2) frequent nocturia. - not diabetic , had kidney stones in 2012.   3) restless leg history- with chronic back pain  4) snoring and  witnessed apnea.   I will order a HST .  I will add ferritin and Fe levels.  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   I plan to follow up either personally or through our NP within 2-3  months.  The dx alone, without initiation of any therapy for apnea, can be used  for FDA approved zepbound   authorization.   I would like  to thank Jenelle Mis, FNP / Herb Loges., MD and or allowing me to meet with and to take care of this pleasant patient.     After spending a total time of  35  minutes face to face and additional time for physical and neurologic examination, review of laboratory studies,  personal review of imaging studies, reports and results of other testing and review of referral information / records as far as provided in visit,   Electronically signed by: Neomia Banner, MD 11/25/2023 3:20 PM  [facility] Board certified by The [employer] of Sleep Medicine and Diplomate of the [employer] of Sleep Medicine. Board certified In Neurology through the ABPN, Fellow of the [employer] of Neurology.

## 2023-11-25 NOTE — Patient Instructions (Signed)
 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  Also nocturnal hypoxia may explain polycythemia .  2) frequent nocturia. - not diabetic , had kidney stones in 2012.  3) restless leg history- with chronic back pain  4) snoring and witnessed apnea.   I will order a HST .  I will add ferritin and Fe levels.  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   I plan to follow up either personally or through our NP within 2-3  months.

## 2023-11-25 NOTE — Assessment & Plan Note (Signed)
 Cholesterol remains elevated. I recommend consuming a heart healthy diet such as Mediterranean diet or DASH diet with whole grains, fruits, vegetable, fish, lean meats, nuts, and olive oil. Limit sweets and processed foods. I also encourage moderate intensity exercise 150 minutes weekly. This is 3-5 times weekly for 30-50 minutes each session. Goal should be pace of 3 miles/hours, or walking 1.5 miles in 30 minutes. The 10-year ASCVD risk score (Arnett DK, et al., 2019) is: 1.7%

## 2023-11-25 NOTE — Assessment & Plan Note (Signed)
 Working on getting Zepbound  through insurance. Encourage low calorie, heart healthy diet and moderate intensity exercise 150 minutes weekly. This is 3-5 times weekly for 30-50 minutes each session. Goal should be pace of 3 miles/hours, or walking 1.5 miles in 30 minutes and include strength training. Has met with [facility].

## 2023-11-25 NOTE — Assessment & Plan Note (Signed)
 BP well controlled on Lisinopril  10mg  daily. Will continue to monitor at home. Awaiting sleep study. Recommend heart healthy diet such as Mediterranean diet with whole grains, fruits, vegetable, fish, lean meats, nuts, and olive oil. Limit salt. Encouraged moderate walking, 3-5 times/week for 30-50 minutes each session. Aim for at least 150 minutes.week. Goal should be pace of 3 miles/hours, or walking 1.5 miles in 30 minutes. Avoid tobacco products. Avoid excess alcohol. Take medications as prescribed and bring medications and blood pressure log with cuff to each office visit. Seek medical care for chest pain, palpitations, shortness of breath with exertion, dizziness/lightheadedness, vision changes, recurrent headaches, or swelling of extremities. Follow up in 3 months

## 2023-11-26 ENCOUNTER — Other Ambulatory Visit (HOSPITAL_COMMUNITY): Payer: Self-pay

## 2023-11-26 ENCOUNTER — Ambulatory Visit: Payer: Self-pay | Admitting: Neurology

## 2023-11-26 DIAGNOSIS — E66811 Obesity, class 1: Secondary | ICD-10-CM

## 2023-11-26 DIAGNOSIS — G4733 Obstructive sleep apnea (adult) (pediatric): Secondary | ICD-10-CM

## 2023-11-26 DIAGNOSIS — K760 Fatty (change of) liver, not elsewhere classified: Secondary | ICD-10-CM

## 2023-11-26 DIAGNOSIS — D751 Secondary polycythemia: Secondary | ICD-10-CM

## 2023-11-26 LAB — IRON,TIBC AND FERRITIN PANEL
Ferritin: 603 ng/mL — ABNORMAL HIGH (ref 30–400)
Iron Saturation: 18 % (ref 15–55)
Iron: 50 ug/dL (ref 38–169)
Total Iron Binding Capacity: 279 ug/dL (ref 250–450)
UIBC: 229 ug/dL (ref 111–343)

## 2023-11-27 NOTE — Telephone Encounter (Signed)
 Pharmacy Patient Advocate Encounter  Received notification from [facility] that Prior Authorization for Zepbound  2.5MG /0.5ML pen-injectors has been DENIED.  Full denial letter will be uploaded to the media tab. See denial reason below.   PA #/Case ID/Reference #: Key: [id number]

## 2023-12-01 NOTE — Telephone Encounter (Signed)
 Good morning, no an appeal will not work if the medication is plan/benefit exclusion on a member's plan.

## 2023-12-07 ENCOUNTER — Other Ambulatory Visit: Payer: Self-pay | Admitting: Family Medicine

## 2023-12-07 ENCOUNTER — Other Ambulatory Visit

## 2023-12-07 DIAGNOSIS — R7989 Other specified abnormal findings of blood chemistry: Secondary | ICD-10-CM

## 2023-12-07 DIAGNOSIS — D751 Secondary polycythemia: Secondary | ICD-10-CM | POA: Diagnosis not present

## 2023-12-08 ENCOUNTER — Ambulatory Visit (INDEPENDENT_AMBULATORY_CARE_PROVIDER_SITE_OTHER): Admitting: Neurology

## 2023-12-08 ENCOUNTER — Ambulatory Visit: Payer: Self-pay | Admitting: Family Medicine

## 2023-12-08 DIAGNOSIS — T560X1D Toxic effect of lead and its compounds, accidental (unintentional), subsequent encounter: Secondary | ICD-10-CM

## 2023-12-08 DIAGNOSIS — D751 Secondary polycythemia: Secondary | ICD-10-CM | POA: Diagnosis not present

## 2023-12-08 DIAGNOSIS — E6609 Other obesity due to excess calories: Secondary | ICD-10-CM

## 2023-12-08 DIAGNOSIS — G4733 Obstructive sleep apnea (adult) (pediatric): Secondary | ICD-10-CM

## 2023-12-08 DIAGNOSIS — K760 Fatty (change of) liver, not elsewhere classified: Secondary | ICD-10-CM

## 2023-12-08 DIAGNOSIS — E66811 Obesity, class 1: Secondary | ICD-10-CM

## 2023-12-08 DIAGNOSIS — J3089 Other allergic rhinitis: Secondary | ICD-10-CM

## 2023-12-08 LAB — CBC WITH DIFFERENTIAL/PLATELET
Absolute Lymphocytes: 1889 {cells}/uL (ref 850–3900)
Absolute Monocytes: 677 {cells}/uL (ref 200–950)
Basophils Absolute: 38 {cells}/uL (ref 0–200)
Basophils Relative: 0.4 %
Eosinophils Absolute: 197 {cells}/uL (ref 15–500)
Eosinophils Relative: 2.1 %
HCT: 48.1 % (ref 38.5–50.0)
Hemoglobin: 16.3 g/dL (ref 13.2–17.1)
MCH: 28.8 pg (ref 27.0–33.0)
MCHC: 33.9 g/dL (ref 32.0–36.0)
MCV: 85 fL (ref 80.0–100.0)
MPV: 10.7 fL (ref 7.5–12.5)
Monocytes Relative: 7.2 %
Neutro Abs: 6599 {cells}/uL (ref 1500–7800)
Neutrophils Relative %: 70.2 %
Platelets: 243 10*3/uL (ref 140–400)
RBC: 5.66 10*6/uL (ref 4.20–5.80)
RDW: 12.7 % (ref 11.0–15.0)
Total Lymphocyte: 20.1 %
WBC: 9.4 10*3/uL (ref 3.8–10.8)

## 2023-12-08 LAB — B12 AND FOLATE PANEL
Folate: 8.6 ng/mL
Vitamin B-12: 314 pg/mL (ref 200–1100)

## 2023-12-08 LAB — IRON,TIBC AND FERRITIN PANEL
%SAT: 33 % (ref 20–48)
Ferritin: 355 ng/mL (ref 38–380)
Iron: 98 ug/dL (ref 50–180)
TIBC: 300 ug/dL (ref 250–425)

## 2023-12-09 NOTE — Progress Notes (Signed)
 [facility]   HOME SLEEP TEST REPORT ( by Watch PAT)   STUDY DATE:  12-08-2023   Timothy Bennett 43 year old male 1981-02-09 ORDERING CLINICIAN: Neomia Banner, MD  REFERRING CLINICIAN: Yolanda Hence, NP    CLINICAL INFORMATION/HISTORY: This patient presented for a new consultation visit on 11-25-2023 he is followed by [name] for his weight  he is also followed for hypertension and for polycythemia by Yolanda Hence, family nurse practitioner.   Epworth sleepiness score: 4/24.   BMI: 31.4 kg/m   Neck Circumference: 18 inches   FINDINGS:   Sleep Summary:   Total Recording Time (hours, min):   7 hours 56 minutes  Total Sleep Time (hours, min):    6 hours 23 minutes             Percent REM (%): 17.6%                                       Respiratory Indices:   Calculated pAHI (per AASM ): Severe obstructive sleep apnea with an AHI of 40.7/h.   REM sleep AHI of 56.5/h,  non-REM sleep AHI 37.7/h.                        Positional AHI:    The majority of sleep was recorded in supine position 290 minutes with an AHI of 42.2, there was 31 minutes of prone sleep and here the AHI was higher at 65.8/h.  The lowest AHI was seen in left lateral sleep at 17.4/h.  Snoring:                                              Mean volume was 45 dB which is moderately loud and snoring was present for 85% of the recorded sleep time.  Oxygen Saturation Statistics:   Oxygen Saturation (%) Mean:     94%           O2 Saturation Range (%): Between the nadir at 86 and a maximal saturation of 99%                                      O2 Saturation (minutes) <89%:   Less than 1 minute        Pulse Rate Statistics:   Pulse Mean (bpm):      58 bpm           Pulse Range: The heart rate varied between a minimum of 40 bpm and a maximum of 98 bpm.  The lowest heart rate was associated with REM sleep.               IMPRESSION:  This HST confirms the presence of severe  obstructive sleep apnea.  There were no central apneas witnessed.  Hypoxemia was also not present but there is clearly an accentuation of the index during REM sleep.  The lack of hypoxemia does not exclude that sleep apnea can be a contributing factor in polycythemia . There is intermittent bradycardia noted as well.  The best treatment option would be positive airway pressure therapy and an autotitration CPAP  device should be considered.     RECOMMENDATION:  #1 I recommend positive airway pressure therapy and use of a RESMED S 11  auto- titration device, pressure settings between 6 and 18 cm water pressure with 2 cm EPR , heated humidification and mask of patient's choice and comfort.   #2 continued weight loss under medical guidance of Dr. Allie Area   #3 avoid supine and prone sleep, the lowest apnea hypopnea index was seen in left lateral sleep position.  An elevated head of bed also may help to decrease apnea further.   This degree of obstructive sleep apnea should definitely help to get approval for Zepbound  as an FDA approved drug in obstructive sleep apnea patients with obesity.   RV after 60-90 days of CPAP use with NP or me,   INTERPRETING PHYSICIAN:   Neomia Banner, MD  [facility] Sleep Board certified by The [employer] of Sleep Medicine and Diplomate of the [employer] of Sleep Medicine. Board certified In Neurology through the ABPN, Fellow of the [employer] of Neurology.

## 2023-12-14 ENCOUNTER — Encounter: Payer: Self-pay | Admitting: Neurology

## 2023-12-17 DIAGNOSIS — G4733 Obstructive sleep apnea (adult) (pediatric): Secondary | ICD-10-CM | POA: Insufficient documentation

## 2023-12-17 NOTE — Procedures (Signed)
 [facility]   HOME SLEEP TEST REPORT ( by Watch PAT)   STUDY DATE:  12-08-2023   Timothy Bennett 43 year old male [birth year] ORDERING CLINICIAN: Neomia Banner, MD  REFERRING CLINICIAN: Yolanda Hence, NP    CLINICAL INFORMATION/HISTORY: This patient presented for a new consultation visit on 11-25-2023 he is followed by [name] for his weight  he is also followed for hypertension and for polycythemia by Yolanda Hence, family nurse practitioner.   Epworth sleepiness score: 4/24.   BMI: 31.4 kg/m   Neck Circumference: 18 inches   FINDINGS:   Sleep Summary:   Total Recording Time (hours, min):   7 hours 56 minutes  Total Sleep Time (hours, min):    6 hours 23 minutes             Percent REM (%): 17.6%                                       Respiratory Indices:   Calculated pAHI (per AASM ): Severe obstructive sleep apnea with an AHI of 40.7/h.   REM sleep AHI of 56.5/h,  non-REM sleep AHI 37.7/h.                        Positional AHI:    The majority of sleep was recorded in supine position 290 minutes with an AHI of 42.2, there was 31 minutes of prone sleep and here the AHI was higher at 65.8/h.  The lowest AHI was seen in left lateral sleep at 17.4/h.  Snoring:                                              Mean volume was 45 dB which is moderately loud and snoring was present for 85% of the recorded sleep time.  Oxygen Saturation Statistics:   Oxygen Saturation (%) Mean:     94%           O2 Saturation Range (%): Between the nadir at 86 and a maximal saturation of 99%                                      O2 Saturation (minutes) <89%:   Less than 1 minute        Pulse Rate Statistics:   Pulse Mean (bpm):      58 bpm           Pulse Range: The heart rate varied between a minimum of 40 bpm and a maximum of 98 bpm.  The lowest heart rate was associated with REM sleep.               IMPRESSION:  This HST confirms the presence of severe  obstructive sleep apnea.  There were no central apneas witnessed.  Hypoxemia was also not present but there is clearly an accentuation of the index during REM sleep.  The lack of hypoxemia does not exclude that sleep apnea can be a contributing factor in polycythemia . There is intermittent bradycardia noted as well.  The best treatment option would be positive airway pressure therapy and an autotitration CPAP device  should be considered.     RECOMMENDATION:  #1 I recommend positive airway pressure therapy and use of a RESMED S 11  auto- titration device, pressure settings between 6 and 18 cm water pressure with 2 cm EPR , heated humidification and mask of patient's choice and comfort.   #2 continued weight loss under medical guidance of Dr. Allie Area   #3 avoid supine and prone sleep, the lowest apnea hypopnea index was seen in left lateral sleep position.  An elevated head of bed also may help to decrease apnea further.   This degree of obstructive sleep apnea should definitely help to get approval for Zepbound  as an FDA approved drug in obstructive sleep apnea patients with obesity.   RV after 60-90 days of CPAP use with NP or me,   INTERPRETING PHYSICIAN:   Neomia Banner, MD  [facility] Sleep Board certified by The [employer] of Sleep Medicine and Diplomate of the [employer] of Sleep Medicine. Board certified In Neurology through the ABPN, Fellow of the [employer] of Neurology.

## 2023-12-17 NOTE — Addendum Note (Signed)
 Addended by: Neomia Banner on: 12/17/2023 05:46 PM   Modules accepted: Orders

## 2023-12-21 ENCOUNTER — Encounter: Payer: Self-pay | Admitting: Family Medicine

## 2023-12-21 ENCOUNTER — Other Ambulatory Visit: Payer: Self-pay | Admitting: Family Medicine

## 2023-12-21 DIAGNOSIS — G4733 Obstructive sleep apnea (adult) (pediatric): Secondary | ICD-10-CM

## 2023-12-21 MED ORDER — TIRZEPATIDE 2.5 MG/0.5ML ~~LOC~~ SOAJ: 2.5000 mg | SUBCUTANEOUS | 0 refills | Status: DC

## 2023-12-24 ENCOUNTER — Encounter (HOSPITAL_COMMUNITY): Payer: Self-pay | Admitting: Emergency Medicine

## 2023-12-24 ENCOUNTER — Other Ambulatory Visit: Payer: Self-pay | Admitting: Family Medicine

## 2023-12-24 ENCOUNTER — Other Ambulatory Visit: Payer: Self-pay

## 2023-12-24 ENCOUNTER — Ambulatory Visit (HOSPITAL_COMMUNITY)
Admission: EM | Admit: 2023-12-24 | Discharge: 2023-12-24 | Disposition: A | Discharge status: Home / Self Care | Attending: Family Medicine | Admitting: Family Medicine

## 2023-12-24 ENCOUNTER — Ambulatory Visit (INDEPENDENT_AMBULATORY_CARE_PROVIDER_SITE_OTHER)

## 2023-12-24 DIAGNOSIS — J9811 Atelectasis: Secondary | ICD-10-CM | POA: Diagnosis not present

## 2023-12-24 DIAGNOSIS — M546 Pain in thoracic spine: Secondary | ICD-10-CM

## 2023-12-24 DIAGNOSIS — G4733 Obstructive sleep apnea (adult) (pediatric): Secondary | ICD-10-CM

## 2023-12-24 DIAGNOSIS — M549 Dorsalgia, unspecified: Secondary | ICD-10-CM | POA: Diagnosis not present

## 2023-12-24 MED ORDER — ZEPBOUND 2.5 MG/0.5ML ~~LOC~~ SOAJ
2.5000 mg | SUBCUTANEOUS | 0 refills | Status: DC
Start: 2023-12-24 — End: 2023-12-24

## 2023-12-24 NOTE — ED Triage Notes (Addendum)
 While driving back from [place], felt like he was choking.  Currently feels like something is on chest.   Patient ate dinner while in [place].  Denies a history of reflux  Patient compares incident to choking on food, but was not eating at the time.  Now patient is coughing

## 2023-12-25 NOTE — Telephone Encounter (Signed)
 First attempt to contact pt. No answer. Lvm for call back

## 2023-12-28 ENCOUNTER — Telehealth: Payer: Self-pay

## 2023-12-28 NOTE — Telephone Encounter (Signed)
 Copied from CRM [id number]. Topic: General - Other >> Dec 28, 2023  9:30 AM Timothy Bennett wrote: Reason for CRM: Patient returning a call from [place] he missed on Friday.  Patient can be reached at [phone]

## 2023-12-29 NOTE — ED Provider Notes (Signed)
 [facility]   [id number] 12/24/23 Arrival Time: 1948  ASSESSMENT & PLAN:  1. Acute midline thoracic back pain   Isolated episode of choking likely culprit. I have personally viewed and independently interpreted the imaging studies ordered this visit. CXR: no acute changes.  Home observation. ED if symptoms return. Discharged home in stable condition..  Reviewed expectations re: course of current medical issues. Questions answered. Outlined signs and symptoms indicating need for more acute intervention. Patient verbalized understanding. After Visit Summary given.   SUBJECTIVE:  History from: patient. Timothy Bennett is a 43 y.o. male who presents with complaint of sudden choking episode while driving; within the hours after leaving [place]. Has resolved. Currently feels like something is in my chest.   Social History   Tobacco Use  Smoking Status Never  Smokeless Tobacco Never   Social History   Substance and Sexual Activity  Alcohol Use Not Currently   Comment: rare    OBJECTIVE:  Vitals:   12/24/23 2001  BP: (!) 142/83  Pulse: 78  Resp: 18  Temp: 98.3 F (36.8 C)  TempSrc: Oral  SpO2: 95%    General appearance: alert, oriented, no acute distress Eyes: PERRLA; EOMI; conjunctivae normal HENT: normocephalic; atraumatic Neck: supple with FROM; trachea midline; no crepitus or swelling Lungs: with labored respirations; speaks full sentences without difficulty; CTAB Heart: regular rate and rhythm with murmer Chest Wall: without tenderness to palpation Skin: warm and dry; without rash or lesions Neuro: normal gait Psychological: alert and cooperative; normal mood and affect  Imaging: DG Chest 2 View Result Date: 12/24/2023 CLINICAL DATA:  back pain s/p choking EXAM: CHEST - 2 VIEW COMPARISON:  None available. FINDINGS: Streaky atelectasis in the left lung base. No focal airspace consolidation, pleural effusion, or pneumothorax. No cardiomegaly. No  acute fracture or destructive lesion. Multilevel thoracic osteophytosis. No radiopaque foreign body. IMPRESSION: No acute cardiopulmonary abnormality. Electronically Signed   By: Rogelia Myers M.D.   On: 12/24/2023 20:29    No Known Allergies  Past Medical History:  Diagnosis Date   Gout    Hypertension    Social History   Socioeconomic History   Marital status: Legally Separated    Spouse name: Not on file   Number of children: Not on file   Years of education: Not on file   Highest education level: GED or equivalent  Occupational History   Not on file  Tobacco Use   Smoking status: Never   Smokeless tobacco: Never  Vaping Use   Vaping status: Never Used  Substance and Sexual Activity   Alcohol use: Not Currently    Comment: rare   Drug use: Never   Sexual activity: Yes  Other Topics Concern   Not on file  Social History Narrative   Not on file   Social Drivers of Health   Financial Resource Strain: Low Risk  (09/07/2023)   Overall Financial Resource Strain (CARDIA)    Difficulty of Paying Living Expenses: Not very hard  Food Insecurity: No Food Insecurity (09/07/2023)   Hunger Vital Sign    Worried About Running Out of Food in the Last Year: Never true    Ran Out of Food in the Last Year: Never true  Transportation Needs: No Transportation Needs (09/07/2023)   PRAPARE - Administrator, Civil Service (Medical): No    Lack of Transportation (Non-Medical): No  Physical Activity: Insufficiently Active (09/07/2023)   Exercise Vital Sign    Days of Exercise per Week:  3 days    Minutes of Exercise per Session: 20 min  Stress: Stress Concern Present (09/07/2023)   [employer] of Occupational Health - Occupational Stress Questionnaire    Feeling of Stress : To some extent  Social Connections: Moderately Isolated (09/07/2023)   Social Connection and Isolation Panel    Frequency of Communication with Friends and Family: Twice a week    Frequency of Social  Gatherings with Friends and Family: Twice a week    Attends Religious Services: Never    Database administrator or Organizations: No    Attends Engineer, structural: Not on file    Marital Status: Married  Intimate Partner Violence: Unknown (10/11/2021)   Received from [facility]   HITS    Physically Hurt: Not on file    Insult or Talk Down To: Not on file    Threaten Physical Harm: Not on file    Scream or Curse: Not on file   Family History  Problem Relation Age of Onset   Sleep apnea Mother    History reviewed. No pertinent surgical history.    Rolinda Rogue, MD 12/29/23 [phone]

## 2023-12-30 NOTE — Telephone Encounter (Signed)
 I called [facility] and spoke with Robin. She said a staff member just called pt a few minutes ago and LVM.

## 2023-12-31 ENCOUNTER — Encounter: Payer: Self-pay | Admitting: Family Medicine

## 2024-01-04 ENCOUNTER — Other Ambulatory Visit (HOSPITAL_COMMUNITY): Payer: Self-pay

## 2024-01-04 NOTE — Telephone Encounter (Signed)
 Good morning, this was denied by his plan.  Please refer to denial letter in pt's media tab from 11/26/2023

## 2024-01-05 ENCOUNTER — Other Ambulatory Visit: Payer: Self-pay

## 2024-01-05 ENCOUNTER — Other Ambulatory Visit: Payer: Self-pay | Admitting: Family Medicine

## 2024-01-05 DIAGNOSIS — E66811 Obesity, class 1: Secondary | ICD-10-CM

## 2024-01-05 DIAGNOSIS — G4733 Obstructive sleep apnea (adult) (pediatric): Secondary | ICD-10-CM | POA: Diagnosis not present

## 2024-01-05 MED ORDER — TIRZEPATIDE-WEIGHT MANAGEMENT 2.5 MG/0.5ML ~~LOC~~ SOLN
2.5000 mg | SUBCUTANEOUS | 1 refills | Status: DC
Start: 2024-01-05 — End: 2024-02-24

## 2024-01-06 ENCOUNTER — Other Ambulatory Visit (HOSPITAL_COMMUNITY): Payer: Self-pay

## 2024-01-06 NOTE — Telephone Encounter (Signed)
 Good morning, no appeal. Please see encounter 11/24/2023 for explanation.

## 2024-01-06 NOTE — Telephone Encounter (Signed)
 Reached out for update, w/ no success. Lvm for pt. To call back for more information.

## 2024-01-12 ENCOUNTER — Encounter: Payer: Self-pay | Admitting: Neurology

## 2024-01-27 NOTE — Telephone Encounter (Signed)
 Patient was listed under his middle name. This has been corrected and I pulled a report.

## 2024-01-27 NOTE — Telephone Encounter (Signed)
 I checked [facility] but could not see patient tagged into system. Will call [facility].

## 2024-02-05 DIAGNOSIS — G4733 Obstructive sleep apnea (adult) (pediatric): Secondary | ICD-10-CM | POA: Diagnosis not present

## 2024-02-09 NOTE — Telephone Encounter (Signed)
 Called to offer the patient opening today at 1 p and he is not able to. Advised we will keep eye open if something comes available

## 2024-02-22 ENCOUNTER — Encounter: Payer: Self-pay | Admitting: Adult Health

## 2024-02-22 ENCOUNTER — Ambulatory Visit: Admitting: Adult Health

## 2024-02-22 VITALS — BP 120/70 | HR 64 | Ht 68.0 in | Wt 199.8 lb

## 2024-02-22 DIAGNOSIS — G4733 Obstructive sleep apnea (adult) (pediatric): Secondary | ICD-10-CM

## 2024-02-22 NOTE — Progress Notes (Signed)
 PATIENT: Timothy Bennett DOB: 10-03-1980  REASON FOR VISIT: follow up HISTORY FROM: patient PRIMARY NEUROLOGIST: Dr. Chalice  Chief Complaint  Patient presents with   Follow-up    Rm 4, alone. Mask fit (2nd) not working.  Rips mask off in night, not aware.  Pressure is too much at 12. SET UP DATE 01-05-2024.     HISTORY OF PRESENT ILLNESS: Today 02/22/24:   Timothy Bennett is a 43 y.o. male with a history of obstructive sleep apnea on CPAP. Returns today for follow-up.  This is his initial CPAP follow-up.  He states that he has been struggling to use the mask.  He is on his second mask.  Has the fullface DreamWear.  He feels that the mask is comfortable and he is able to fall asleep but when he wakes up the mask is off.  He also feels that the pressure is too high.  His download is below     HISTORY   REVIEW OF SYSTEMS: Out of a complete 14 system review of symptoms, the patient complains only of the following symptoms, and all other reviewed systems are negative.  FSS ESS  ALLERGIES: No Known Allergies  HOME MEDICATIONS: Outpatient Medications Prior to Visit  Medication Sig Dispense Refill   lisinopril  (ZESTRIL ) 10 MG tablet Take 1 tablet (10 mg total) by mouth daily. 90 tablet 3   tirzepatide  (ZEPBOUND ) 2.5 MG/0.5ML injection vial Inject 2.5 mg into the skin once a week. (Patient not taking: Reported on 02/22/2024) 2 mL 1   No facility-administered medications prior to visit.    PAST MEDICAL HISTORY: Past Medical History:  Diagnosis Date   Gout    Hypertension     PAST SURGICAL HISTORY: History reviewed. No pertinent surgical history.  FAMILY HISTORY: Family History  Problem Relation Age of Onset   Sleep apnea Mother     SOCIAL HISTORY: Social History   Socioeconomic History   Marital status: Legally Separated    Spouse name: Not on file   Number of children: Not on file   Years of education: Not on file   Highest education level: GED or equivalent   Occupational History   Not on file  Tobacco Use   Smoking status: Never   Smokeless tobacco: Never  Vaping Use   Vaping status: Never Used  Substance and Sexual Activity   Alcohol use: Not Currently    Comment: rare   Drug use: Never   Sexual activity: Yes  Other Topics Concern   Not on file  Social History Narrative   Not on file   Social Drivers of Health   Financial Resource Strain: Low Risk  (09/07/2023)   Overall Financial Resource Strain (CARDIA)    Difficulty of Paying Living Expenses: Not very hard  Food Insecurity: No Food Insecurity (09/07/2023)   Hunger Vital Sign    Worried About Running Out of Food in the Last Year: Never true    Ran Out of Food in the Last Year: Never true  Transportation Needs: No Transportation Needs (09/07/2023)   PRAPARE - Administrator, Civil Service (Medical): No    Lack of Transportation (Non-Medical): No  Physical Activity: Insufficiently Active (09/07/2023)   Exercise Vital Sign    Days of Exercise per Week: 3 days    Minutes of Exercise per Session: 20 min  Stress: Stress Concern Present (09/07/2023)   [employer] of Occupational Health - Occupational Stress Questionnaire    Feeling of Stress : To  some extent  Social Connections: Moderately Isolated (09/07/2023)   Social Connection and Isolation Panel    Frequency of Communication with Friends and Family: Twice a week    Frequency of Social Gatherings with Friends and Family: Twice a week    Attends Religious Services: Never    Database administrator or Organizations: No    Attends Engineer, structural: Not on file    Marital Status: Married  Intimate Partner Violence: Unknown (10/11/2021)   Received from [facility]   HITS    Physically Hurt: Not on file    Insult or Talk Down To: Not on file    Threaten Physical Harm: Not on file    Scream or Curse: Not on file      PHYSICAL EXAM  Vitals:   02/22/24 1126  BP: 120/70  Pulse: 64  Weight: 199 lb  12.8 oz (90.6 kg)  Height: 5' 8 (1.727 m)   Body mass index is 30.38 kg/m.  Generalized: Well developed, in no acute distress  Chest: Lungs clear to auscultation bilaterally  Neurological examination  Mentation: Alert oriented to time, place, history taking. Follows all commands speech and language fluent Cranial nerve II-XII: Facial symmetry noted   DIAGNOSTIC DATA (LABS, IMAGING, TESTING) - I reviewed patient records, labs, notes, testing and imaging myself where available.  Lab Results  Component Value Date   WBC 9.4 12/07/2023   HGB 16.3 12/07/2023   HCT 48.1 12/07/2023   MCV 85.0 12/07/2023   PLT 243 12/07/2023      Component Value Date/Time   NA 140 11/17/2023 0824   K 4.4 11/17/2023 0824   CL 105 11/17/2023 0824   CO2 27 11/17/2023 0824   GLUCOSE 96 11/17/2023 0824   BUN 18 11/17/2023 0824   CREATININE 1.07 11/17/2023 0824   CALCIUM 9.3 11/17/2023 0824   PROT 6.8 11/17/2023 0824   ALBUMIN 4.2 11/21/2010 2111   AST 18 11/17/2023 0824   ALT 29 11/17/2023 0824   ALKPHOS 52 11/21/2010 2111   BILITOT 0.6 11/17/2023 0824   GFRNONAA 49 (L) 11/21/2010 2111   GFRAA (L) 11/21/2010 2111    59        The eGFR has been calculated using the MDRD equation. This calculation has not been validated in all clinical situations. eGFR's persistently <60 mL/min signify possible Chronic Kidney Disease.   Lab Results  Component Value Date   CHOL 203 (H) 11/17/2023   HDL 47 11/17/2023   LDLCALC 119 (H) 11/17/2023   TRIG 246 (H) 11/17/2023   CHOLHDL 4.3 11/17/2023   No results found for: HGBA1C Lab Results  Component Value Date   VITAMINB12 314 12/07/2023   No results found for: TSH    ASSESSMENT AND PLAN 43 y.o. year old male  has a past medical history of Gout and Hypertension. here with:  OSA on CPAP  - CPAP compliance excellent - Good treatment of AHI  - Encourage patient to use CPAP nightly and > 4 hours each night - Will adjust pressure 6 to 12 cm  of water-could try a set pressure in the future - Advised the patient to set an alarm 2 to 3 hours after he plans to go to sleep.  He could try this for 2 weeks to help him put the mask back on if he is taking it off. - Reviewed the potential adverse effects of untreated sleep apnea - F/U in 1 year or sooner if needed   Orders Placed  This Encounter  Procedures   For home use only DME continuous positive airway pressure (CPAP)      Duwaine Russell, MSN, NP-C 02/22/2024, 1:22 PM [facility] [address], [address] [place], [place] [address] [phone]  The patient's condition requires frequent monitoring and adjustments in the treatment plan, reflecting the ongoing complexity of care.  This provider is the continuing focal point for all needed services for this condition.

## 2024-02-22 NOTE — Patient Instructions (Signed)
 Continue using CPAP nightly and greater than 4 hours each night Set alarm  If your symptoms worsen or you develop new symptoms please let us  know.

## 2024-02-23 NOTE — Progress Notes (Addendum)
 DME order sent to [facility].   Zott, Glade Boring, Heather CROME, RN; [place], [place]; [place], Verneita Got It Thank you

## 2024-02-24 ENCOUNTER — Ambulatory Visit: Admitting: Family Medicine

## 2024-02-24 ENCOUNTER — Encounter: Payer: Self-pay | Admitting: Family Medicine

## 2024-02-24 VITALS — BP 122/85 | HR 67 | Temp 99.0°F | Ht 68.0 in | Wt 195.2 lb

## 2024-02-24 DIAGNOSIS — Z6831 Body mass index (BMI) 31.0-31.9, adult: Secondary | ICD-10-CM

## 2024-02-24 DIAGNOSIS — E782 Mixed hyperlipidemia: Secondary | ICD-10-CM | POA: Diagnosis not present

## 2024-02-24 DIAGNOSIS — E66811 Obesity, class 1: Secondary | ICD-10-CM

## 2024-02-24 DIAGNOSIS — I10 Essential (primary) hypertension: Secondary | ICD-10-CM

## 2024-02-24 NOTE — Assessment & Plan Note (Signed)
 Continue to work on diet and exercise

## 2024-02-24 NOTE — Assessment & Plan Note (Signed)
 BP well controlled on Lisinopril  10mg  daily. Will continue to monitor at home. Awaiting sleep study. Recommend heart healthy diet such as Mediterranean diet with whole grains, fruits, vegetable, fish, lean meats, nuts, and olive oil. Limit salt. Encouraged moderate walking, 3-5 times/week for 30-50 minutes each session. Aim for at least 150 minutes.week. Goal should be pace of 3 miles/hours, or walking 1.5 miles in 30 minutes. Avoid tobacco products. Avoid excess alcohol. Take medications as prescribed and bring medications and blood pressure log with cuff to each office visit. Seek medical care for chest pain, palpitations, shortness of breath with exertion, dizziness/lightheadedness, vision changes, recurrent headaches, or swelling of extremities. Follow up in 6 months

## 2024-02-24 NOTE — Progress Notes (Signed)
 Subjective:  HPI: Timothy Bennett is a 43 y.o. male presenting on 02/24/2024 for No chief complaint on file.   HPI Patient is in today for HTN follow up. Has been working on diet and exercise. Tolerating LIsinopril  10mg  daily. Is working on getting his CPAP adjusted to it better so he can tolerate it. Did not start Zepbound  due to cost.    HYPERTENSION / HYPERLIPIDEMIA Satisfied with current treatment? yes Duration of hypertension: chronic BP monitoring frequency: a few times a month BP range:  BP medication side effects: no Past BP meds: lisinopril  Duration of hyperlipidemia: chronic Cholesterol medication side effects: no Cholesterol supplements: none  Past cholesterol medications: none Medication compliance: excellent compliance Aspirin: no Recent stressors: no Recurrent headaches: no Visual changes: no Palpitations: no Dyspnea: no Chest pain: no Lower extremity edema: no Dizzy/lightheaded: no  The 10-year ASCVD risk score (Arnett DK, et al., 2019) is: 1.5%   Values used to calculate the score:     Age: 32 years     Clincally relevant sex: Male     Is Non-Hispanic African American: No     Diabetic: No     Tobacco smoker: No     Systolic Blood Pressure: 122 mmHg     Is BP treated: Yes     HDL Cholesterol: 47 mg/dL     Total Cholesterol: 203 mg/dL    Review of Systems  All other systems reviewed and are negative.   Relevant past medical history reviewed and updated as indicated.   Past Medical History:  Diagnosis Date   Gout    Hypertension      History reviewed. No pertinent surgical history.  Allergies and medications reviewed and updated.   Current Outpatient Medications:    lisinopril  (ZESTRIL ) 10 MG tablet, Take 1 tablet (10 mg total) by mouth daily., Disp: 90 tablet, Rfl: 3  No Known Allergies  Objective:   BP 122/85   Pulse 67   Temp 99 F (37.2 C)   Ht 5' 8 (1.727 m)   Wt 195 lb 3.2 oz (88.5 kg)   SpO2 97%   BMI 29.68 kg/m       02/24/2024    3:53 PM 02/22/2024   11:26 AM 12/24/2023    8:01 PM  Vitals with BMI  Height 5' 8 5' 8   Weight 195 lbs 3 oz 199 lbs 13 oz   BMI 29.69 30.39   Systolic 122 120 857  Diastolic 85 70 83  Pulse 67 64 78     Physical Exam Vitals and nursing note reviewed.  Constitutional:      Appearance: Normal appearance. He is normal weight.  HENT:     Head: Normocephalic and atraumatic.  Cardiovascular:     Rate and Rhythm: Normal rate and regular rhythm.     Pulses: Normal pulses.     Heart sounds: Normal heart sounds.  Pulmonary:     Effort: Pulmonary effort is normal.     Breath sounds: Normal breath sounds.  Skin:    General: Skin is warm and dry.     Capillary Refill: Capillary refill takes less than 2 seconds.  Neurological:     General: No focal deficit present.     Mental Status: He is alert and oriented to person, place, and time. Mental status is at baseline.  Psychiatric:        Mood and Affect: Mood normal.        Behavior: Behavior normal.  Thought Content: Thought content normal.        Judgment: Judgment normal.     Assessment & Plan:  Primary hypertension Assessment & Plan: BP well controlled on Lisinopril  10mg  daily. Will continue to monitor at home. Awaiting sleep study. Recommend heart healthy diet such as Mediterranean diet with whole grains, fruits, vegetable, fish, lean meats, nuts, and olive oil. Limit salt. Encouraged moderate walking, 3-5 times/week for 30-50 minutes each session. Aim for at least 150 minutes.week. Goal should be pace of 3 miles/hours, or walking 1.5 miles in 30 minutes. Avoid tobacco products. Avoid excess alcohol. Take medications as prescribed and bring medications and blood pressure log with cuff to each office visit. Seek medical care for chest pain, palpitations, shortness of breath with exertion, dizziness/lightheadedness, vision changes, recurrent headaches, or swelling of extremities. Follow up in 6 months   Moderate  mixed hyperlipidemia not requiring statin therapy Assessment & Plan: I recommend consuming a heart healthy diet such as Mediterranean diet or DASH diet with whole grains, fruits, vegetable, fish, lean meats, nuts, and olive oil. Limit sweets and processed foods. I also encourage moderate intensity exercise 150 minutes weekly. This is 3-5 times weekly for 30-50 minutes each session. Goal should be pace of 3 miles/hours, or walking 1.5 miles in 30 minutes. The 10-year ASCVD risk score (Arnett DK, et al., 2019) is: 1.5%    Class 1 obesity with serious comorbidity and body mass index (BMI) of 31.0 to 31.9 in adult, unspecified obesity type Assessment & Plan: Continue to work on diet and exercise.       Follow up plan: Return in about 6 months (around 08/26/2024) for annual physical with labs 1 week prior.  Jeoffrey GORMAN Barrio, FNP

## 2024-02-24 NOTE — Assessment & Plan Note (Signed)
 I recommend consuming a heart healthy diet such as Mediterranean diet or DASH diet with whole grains, fruits, vegetable, fish, lean meats, nuts, and olive oil. Limit sweets and processed foods. I also encourage moderate intensity exercise 150 minutes weekly. This is 3-5 times weekly for 30-50 minutes each session. Goal should be pace of 3 miles/hours, or walking 1.5 miles in 30 minutes. The 10-year ASCVD risk score (Arnett DK, et al., 2019) is: 1.5%

## 2024-02-25 ENCOUNTER — Encounter: Payer: Self-pay | Admitting: Family Medicine

## 2024-03-01 ENCOUNTER — Encounter (HOSPITAL_COMMUNITY): Payer: Self-pay | Admitting: Emergency Medicine

## 2024-03-01 ENCOUNTER — Ambulatory Visit (HOSPITAL_COMMUNITY)
Admission: EM | Admit: 2024-03-01 | Discharge: 2024-03-01 | Disposition: A | Discharge status: Home / Self Care | Attending: Family Medicine | Admitting: Family Medicine

## 2024-03-01 DIAGNOSIS — M109 Gout, unspecified: Secondary | ICD-10-CM | POA: Diagnosis not present

## 2024-03-01 MED ORDER — COLCHICINE 0.6 MG PO TABS: 0.6000 mg | ORAL_TABLET | Freq: Every day | ORAL | 0 refills | Status: DC | PRN

## 2024-03-01 MED ORDER — PREDNISONE 20 MG PO TABS: 40.0000 mg | ORAL_TABLET | Freq: Every day | ORAL | 0 refills | Status: AC

## 2024-03-01 NOTE — Discharge Instructions (Addendum)
 Take prednisone  20 mg--2 daily for 5 days  Colchicine  0.6 mg--1 tablet daily as needed for gout pain  Please follow-up with your primary care about this issue

## 2024-03-01 NOTE — ED Triage Notes (Signed)
 Pt c/o gout flare up at medical base of right big toe for a couple days. Has swelling and redness. Yesterday took Diazepam that had left over from previous visit. Reports ibuprofen  doesn't help.

## 2024-03-01 NOTE — ED Provider Notes (Signed)
 [facility]    CSN: [id number] Arrival date & time: 03/01/24  9195      History   Chief Complaint Chief Complaint  Patient presents with   Foot Pain    HPI Timothy Bennett is a 43 y.o. male.    Foot Pain  Here for pain in his right first MTP joint.  Began yesterday.  It is a little red and a little swollen.  No fever  he does have a history of gout and the last attack was about 10 or 11 months ago.  NKDA  Last EGFR was 89 in May of this year.  Past Medical History:  Diagnosis Date   Gout    Hypertension     Patient Active Problem List   Diagnosis Date Noted   Severe obstructive sleep apnea-hypopnea syndrome 12/17/2023   Hypertension 11/03/2023   Chronic midline low back pain with bilateral sciatica 11/03/2023   Polycythemia 10/20/2023   Metabolic dysfunction-associated steatotic liver disease (MASLD) 10/20/2023   Environmental and seasonal allergies 10/07/2023   Snoring 10/07/2023   Primary hypertension 09/16/2023   Moderate mixed hyperlipidemia not requiring statin therapy 09/16/2023   Physical exam, annual 09/08/2023   Class 1 obesity with serious comorbidity and body mass index (BMI) of 31.0 to 31.9 in adult 09/08/2023   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 No pertinent surgical history.     Home Medications    Prior to Admission medications   Medication Sig Start Date End Date Taking? Authorizing Provider  colchicine  0.6 MG tablet Take 1 tablet (0.6 mg total) by mouth daily as needed (gout pain). 03/01/24  Yes Vonna Sharlet POUR, MD  predniSONE  (DELTASONE ) 20 MG tablet Take 2 tablets (40 mg total) by mouth daily with breakfast for 5 days. 03/01/24 03/06/24 Yes Vonna Sharlet POUR, MD  lisinopril  (ZESTRIL ) 10 MG tablet Take 1 tablet (10 mg total) by mouth daily. 11/03/23   Kayla Jeoffrey RAMAN, FNP    Family History Family History  Problem Relation Age of Onset   Sleep apnea Mother     Social  History Social History   Tobacco Use   Smoking status: Never   Smokeless tobacco: Never  Vaping Use   Vaping status: Never Used  Substance Use Topics   Alcohol use: Not Currently    Comment: rare   Drug use: Never     Allergies   Patient has no known allergies.   Review of Systems Review of Systems   Physical Exam Triage Vital Signs ED Triage Vitals  Encounter Vitals Group     BP 03/01/24 0843 (!) 139/92     Girls Systolic BP Percentile --      Girls Diastolic BP Percentile --      Boys Systolic BP Percentile --      Boys Diastolic BP Percentile --      Pulse Rate 03/01/24 0843 (!) 58     Resp 03/01/24 0843 16     Temp 03/01/24 0843 98.1 F (36.7 C)     Temp src --      SpO2 03/01/24 0843 98 %     Weight --      Height --      Head Circumference --      Peak Flow --      Pain Score 03/01/24 0842 7     Pain Loc --      Pain Education --      Jonette  from Growth Chart --    No data found.  Updated Vital Signs BP (!) 139/92 (BP Location: Left Arm)   Pulse (!) 58   Temp 98.1 F (36.7 C)   Resp 16   SpO2 98%   Visual Acuity Right Eye Distance:   Left Eye Distance:   Bilateral Distance:    Right Eye Near:   Left Eye Near:    Bilateral Near:     Physical Exam Vitals reviewed.  Constitutional:      General: He is not in acute distress.    Appearance: He is not ill-appearing, toxic-appearing or diaphoretic.  Musculoskeletal:     Comments: There is a little erythema of the first MTP with some swelling and tenderness there.  Pulses in the foot are normal.+  Skin:    Coloration: Skin is not jaundiced or pale.  Neurological:     Mental Status: He is alert.  Psychiatric:        Behavior: Behavior normal.      UC Treatments / Results  Labs (all labs ordered are listed, but only abnormal results are displayed) Labs Reviewed - No data to display  EKG   Radiology No results found.  Procedures Procedures (including critical care  time)  Medications Ordered in UC Medications - No data to display  Initial Impression / Assessment and Plan / UC Course  I have reviewed the triage vital signs and the nursing notes.  Pertinent labs & imaging results that were available during my care of the patient were reviewed by me and considered in my medical decision making (see chart for details).     5-day burst of prednisone  and colchicine  are sent in to treat the gout exacerbation.  I have asked him to follow-up with his primary care about this issue.  We briefly discussed diet that could help lower his risk of developing further exacerbations. Final Clinical Impressions(s) / UC Diagnoses   Final diagnoses:  Exacerbation of gout     Discharge Instructions      Take prednisone  20 mg--2 daily for 5 days  Colchicine  0.6 mg--1 tablet daily as needed for gout pain  Please follow-up with your primary care about this issue     ED Prescriptions     Medication Sig Dispense Auth. Provider   predniSONE  (DELTASONE ) 20 MG tablet Take 2 tablets (40 mg total) by mouth daily with breakfast for 5 days. 10 tablet Vonna Sharlet POUR, MD   colchicine  0.6 MG tablet Take 1 tablet (0.6 mg total) by mouth daily as needed (gout pain). 15 tablet Varetta Chavers, Sharlet POUR, MD      I have reviewed the PDMP during this encounter.   Vonna Sharlet POUR, MD 03/01/24 [phone]

## 2024-03-07 DIAGNOSIS — G4733 Obstructive sleep apnea (adult) (pediatric): Secondary | ICD-10-CM | POA: Diagnosis not present

## 2024-03-17 ENCOUNTER — Encounter: Payer: Self-pay | Admitting: Family Medicine

## 2024-03-22 ENCOUNTER — Encounter: Payer: Self-pay | Admitting: Family Medicine

## 2024-03-22 ENCOUNTER — Ambulatory Visit: Admitting: Family Medicine

## 2024-03-22 VITALS — BP 124/83 | HR 61 | Temp 98.4°F | Ht 68.0 in | Wt 191.6 lb

## 2024-03-22 DIAGNOSIS — M7711 Lateral epicondylitis, right elbow: Secondary | ICD-10-CM

## 2024-03-22 MED ORDER — MELOXICAM 7.5 MG PO TABS: 7.5000 mg | ORAL_TABLET | Freq: Every day | ORAL | 0 refills | Status: AC

## 2024-03-22 NOTE — Progress Notes (Signed)
 Subjective:  HPI: Xachary Hambly is a 43 y.o. male presenting on 03/22/2024 for Acute Visit (Right Elbow pain x 6 wks.  Moving from center of elbow up arm and down to fingers. 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 Has used compression, ice and heat noting helps the pain. Having trouble lifting light weight )   HPI Patient is in today for lateral elbow pain for 6 weeks. No identifiable injury however he noticed it the day after painting a kitchen. He is a Education administrator by trade. He describes the pain as shooting from the center of his elbow to his fingers and it feels like burning. It is worse when lifting or carrying things. Has been using ice, a brace, rest, compression, and Ibuprofen . Denies redness, warmth, bruising, or swelling.    Review of Systems  All other systems reviewed and are negative.   Relevant past medical history reviewed and updated as indicated.   Past Medical History:  Diagnosis Date   Gout    Hypertension      History reviewed. No pertinent surgical history.  Allergies and medications reviewed and updated.   Current Outpatient Medications:    lisinopril  (ZESTRIL ) 10 MG tablet, Take 1 tablet (10 mg total) by mouth daily., Disp: 90 tablet, Rfl: 3   meloxicam  (MOBIC ) 7.5 MG tablet, Take 1 tablet (7.5 mg total) by mouth daily., Disp: 30 tablet, Rfl: 0  No Known Allergies  Objective:   BP 124/83   Pulse 61   Temp 98.4 F (36.9 C)   Ht 5' 8 (1.727 m)   Wt 191 lb 9.6 oz (86.9 kg)   SpO2 98%   BMI 29.13 kg/m      03/22/2024    3:51 PM 03/01/2024    8:43 AM 02/24/2024    3:53 PM  Vitals with BMI  Height 5' 8  5' 8  Weight 191 lbs 10 oz  195 lbs 3 oz  BMI 29.14  29.69  Systolic 124 139 877  Diastolic 83 92 85  Pulse 61 58 67     Physical Exam Vitals and nursing note reviewed.  Constitutional:      Appearance: Normal appearance. He is normal weight.  HENT:     Head: Normocephalic and atraumatic.  Musculoskeletal:     Right  elbow: No swelling or deformity. Normal range of motion. Tenderness present in lateral epicondyle.  Skin:    General: Skin is warm and dry.     Capillary Refill: Capillary refill takes less than 2 seconds.  Neurological:     General: No focal deficit present.     Mental Status: He is alert and oriented to person, place, and time. Mental status is at baseline.  Psychiatric:        Mood and Affect: Mood normal.        Behavior: Behavior normal.        Thought Content: Thought content normal.        Judgment: Judgment normal.     Assessment & Plan:  Lateral epicondylitis of right elbow Assessment & Plan: Tenderness to palpation of lateral epicondyle and pain with resisted wrist extension. Ongoing for 6 weeks. Declines imaging. Would like to continue conservative measures for 2 more weeks. Encouraged to wear counterforce brace and use ice. Exercises provided. Stop Ibuprofen  and start meloxicam  7.5mg  daily. Advised not to take with Ibuprofen  and monitor for s/s of GIB. Will refer to ortho or obtain x-ray if persists. Follow up PRN  Other orders -     Meloxicam ; Take 1 tablet (7.5 mg total) by mouth daily.  Dispense: 30 tablet; Refill: 0     Follow up plan: Return if symptoms worsen or fail to improve.  Jeoffrey GORMAN Barrio, FNP

## 2024-03-22 NOTE — Assessment & Plan Note (Addendum)
 Tenderness to palpation of lateral epicondyle and pain with resisted wrist extension. Ongoing for 6 weeks. Declines imaging. Would like to continue conservative measures for 2 more weeks. Encouraged to wear counterforce brace and use ice. Exercises provided. Stop Ibuprofen  and start meloxicam  7.5mg  daily. Advised not to take with Ibuprofen  and monitor for s/s of GIB. Will refer to ortho or obtain x-ray if persists. Follow up PRN

## 2024-04-13 ENCOUNTER — Encounter: Payer: Self-pay | Admitting: Family Medicine

## 2024-05-08 ENCOUNTER — Encounter (HOSPITAL_COMMUNITY): Payer: Self-pay

## 2024-05-08 ENCOUNTER — Ambulatory Visit (HOSPITAL_COMMUNITY): Admission: EM | Admit: 2024-05-08 | Discharge: 2024-05-08 | Disposition: A | Discharge status: Home / Self Care

## 2024-05-08 DIAGNOSIS — M7711 Lateral epicondylitis, right elbow: Secondary | ICD-10-CM

## 2024-05-08 NOTE — ED Provider Notes (Signed)
 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  Patient states he was provided with a prescription for a lump meloxicam  which really has not helped, states if anything is actually gotten worse.  Patient states he has continued to work despite pain.  Patient states the pain gets worse when he shakes someone's hand or tries to grip something, particularly a soda bottle or cup of coffee.  Patient was advised that at this point he would benefit more from being seen by an orthopedic provider having tried and failed conservative care.  Patient was redirected to [facility] urgent [facility] for further evaluation and treatment.  No billable services were provided for this patient today.   Joesph Shaver Scales, PA-C 05/08/24 1614

## 2024-05-08 NOTE — ED Triage Notes (Addendum)
 Patient reports that he has had pain near his right elbow x 2 months. Patient states he went to his PCP 3-4 weeks ago and was told he had tennis elbow. Patient states he was told it would go away on its own, but has not. Patient states he was prescribed Possibly Meloxicam .  Patient states pain worsens when shaking someone's hand or grab a soda bottle, anything that he has to grip.

## 2024-07-11 ENCOUNTER — Encounter: Payer: Self-pay | Admitting: Family Medicine

## 2024-07-12 ENCOUNTER — Other Ambulatory Visit: Payer: Self-pay

## 2024-07-12 MED ORDER — LISINOPRIL 10 MG PO TABS: 10.0000 mg | ORAL_TABLET | Freq: Every day | ORAL | 3 refills | Status: AC

## 2024-07-15 ENCOUNTER — Encounter: Payer: Self-pay | Admitting: Family Medicine

## 2024-07-15 ENCOUNTER — Ambulatory Visit: Payer: Self-pay | Admitting: Family Medicine

## 2024-07-15 VITALS — BP 142/92 | HR 66 | Ht 68.0 in | Wt 206.3 lb

## 2024-07-15 DIAGNOSIS — M109 Gout, unspecified: Secondary | ICD-10-CM | POA: Diagnosis not present

## 2024-07-15 DIAGNOSIS — I10 Essential (primary) hypertension: Secondary | ICD-10-CM | POA: Diagnosis not present

## 2024-07-15 MED ORDER — PREDNISONE 10 MG (21) PO TBPK: ORAL_TABLET | ORAL | 0 refills | Status: AC

## 2024-07-15 MED ORDER — COLCHICINE 0.6 MG PO TABS: 0.6000 mg | ORAL_TABLET | Freq: Every day | ORAL | 2 refills | Status: AC

## 2024-07-15 MED ORDER — ALLOPURINOL 100 MG PO TABS: 100.0000 mg | ORAL_TABLET | Freq: Every day | ORAL | 3 refills | Status: AC

## 2024-07-15 NOTE — Progress Notes (Signed)
 "  Patient Office Visit  Assessment & Plan:  Gouty arthritis of right great toe -     Allopurinol ; Take 1 tablet (100 mg total) by mouth daily.  Dispense: 90 tablet; Refill: 3 -     Colchicine ; Take 1 tablet (0.6 mg total) by mouth daily.  Dispense: 30 tablet; Refill: 2 -     predniSONE ; Use as directed.  Dispense: 21 each; Refill: 0 -     Uric acid  Hypertension, unspecified type   Assessment and Plan    Gouty arthritis of right great toe Recurrent gouty arthritis with significant discomfort. Interested in preventative treatment. - Prescribed prednisone  for acute management. - Prescribed allopurinol  for prevention. - Ordered uric acid level test. - Advised against ibuprofen  with prednisone . - Discussed colchicine  but recommended prednisone  for faster relief.     We discussed switching blood pressure medicine lisinopril  to losartan due to history of gout but patient states he is rather stay with the lisinopril  at this time.  Return if symptoms worsen or fail to improve.   Subjective:    Patient ID: Timothy Bennett, male    DOB: January 22, 1981  Age: 44 y.o. MRN: [id number]  Chief Complaint  Patient presents with   Foot Pain    Pt states his right foot is having a gout flare up and would like to see if he can get something for the pain. Started 06/30/24 hasn't went away since then.    Foot Pain   Discussed the use of AI scribe software for clinical note transcription with the patient, who gave verbal consent to proceed.      History of Present Illness Timothy Bennett is a 44 year old male with gout who presents with a gout flare in the right foot.  The current gout flare began on Christmas day, affecting the right foot, particularly the big toe side. The pain is described as aching and has persisted for two weeks. Similar episodes occur approximately every three months, initially affecting the left foot before shifting to the right. Patient usually gets steroids which help  He  has been treated with prednisone  in the past, which helps alleviate symptoms. He has not used colchicine  before, only prednisone . He has been taking ibuprofen  to manage the pain and has tried arthritis cream without relief. He denies using cherry extract or consuming known dietary triggers like sardines or shellfish. Patient not sure what triggers are for him  He mentions that he was dieting well before the holidays, which may have influenced the frequency of episodes. A previous episode in August required urgent care. He has not had a uric acid level checked before and is interested in understanding his current levels.  Sleep has been difficult due to the pain, particularly at night.  He denies any family history of gout and states he is the first in his family to experience it. He does not smoke cigarettes and has not received a flu shot this season.  He is currently taking lisinopril  10 mg for high blood pressure, which has been effective after switching from a previous medication that caused side effects. Patient does not want to change to Losartan at this time  Physical Exam MUSCULOSKELETAL: Right foot swollen with tenderness on the big toe.  Assessment and Plan Gouty arthritis of right great toe Recurrent gouty arthritis with significant discomfort. Interested in preventative treatment. - Prescribed prednisone  for acute management. - Prescribed allopurinol  for prevention. - Ordered uric acid level test. - Advised against ibuprofen  with  prednisone . - Discussed colchicine  but recommended prednisone  for faster relief.    The 10-year ASCVD risk score (Arnett DK, et al., 2019) is: 2.2%  Past Medical History:  Diagnosis Date   Gout    Hypertension    History reviewed. No pertinent surgical history. Social History[1] Family History  Problem Relation Age of Onset   Sleep apnea Mother    Cancer Mother    Allergies[2]  ROS    Objective:    BP (!) 142/92 (BP Location: Left  Arm, Patient Position: Sitting, Cuff Size: Normal)   Pulse 66   Ht 5' 8 (1.727 m)   Wt 206 lb 4.8 oz (93.6 kg)   SpO2 97%   BMI 31.37 kg/m  BP Readings from Last 3 Encounters:  07/15/24 (!) 142/92  05/08/24 118/89  03/22/24 124/83   Wt Readings from Last 3 Encounters:  07/15/24 206 lb 4.8 oz (93.6 kg)  03/22/24 191 lb 9.6 oz (86.9 kg)  02/24/24 195 lb 3.2 oz (88.5 kg)    Physical Exam Vitals and nursing note reviewed.  Constitutional:      General: He is not in acute distress.    Appearance: Normal appearance.  HENT:     Head: Normocephalic.     Right Ear: Tympanic membrane, ear canal and external ear normal.     Left Ear: Tympanic membrane, ear canal and external ear normal.  Eyes:     Extraocular Movements: Extraocular movements intact.     Conjunctiva/sclera: Conjunctivae normal.     Pupils: Pupils are equal, round, and reactive to light.  Cardiovascular:     Rate and Rhythm: Normal rate and regular rhythm.     Heart sounds: Normal heart sounds.  Pulmonary:     Effort: Pulmonary effort is normal.     Breath sounds: Normal breath sounds.  Musculoskeletal:     Right lower leg: No edema.     Left lower leg: No edema.     Right foot: Decreased range of motion. Swelling and tenderness present.     Comments: Patient is walking with a limp  Neurological:     General: No focal deficit present.     Mental Status: He is alert and oriented to person, place, and time.  Psychiatric:        Mood and Affect: Mood normal.        Behavior: Behavior normal.      No results found for any visits on 07/15/24.          [1]  Social History Tobacco Use   Smoking status: Never   Smokeless tobacco: Never  Vaping Use   Vaping status: Never Used  Substance Use Topics   Alcohol use: Not Currently    Comment: rare   Drug use: Never  [2] No Known Allergies  "

## 2024-07-16 ENCOUNTER — Ambulatory Visit: Payer: Self-pay | Admitting: Family Medicine

## 2024-07-16 LAB — URIC ACID: Uric Acid, Serum: 8.6 mg/dL — ABNORMAL HIGH (ref 4.0–8.0)

## 2024-07-26 ENCOUNTER — Ambulatory Visit: Admitting: Family Medicine

## 2024-07-26 ENCOUNTER — Encounter: Payer: Self-pay | Admitting: Family Medicine

## 2024-07-26 VITALS — BP 117/89 | HR 77 | Temp 97.8°F | Ht 68.0 in | Wt 199.4 lb

## 2024-07-26 DIAGNOSIS — M109 Gout, unspecified: Secondary | ICD-10-CM | POA: Diagnosis not present

## 2024-07-26 DIAGNOSIS — Z6831 Body mass index (BMI) 31.0-31.9, adult: Secondary | ICD-10-CM | POA: Diagnosis not present

## 2024-07-26 DIAGNOSIS — E66811 Obesity, class 1: Secondary | ICD-10-CM

## 2024-07-26 NOTE — Progress Notes (Signed)
 "  Acute Office Visit  Patient ID: Timothy Bennett, male    DOB: 1980/12/24, 44 y.o.   MRN: [id number]  PCP: Kayla Jeoffrey RAMAN, FNP  Chief Complaint  Patient presents with   Medical Management of Chronic Issues    Discuss weight loss medication Would like to discuss gout      Subjective:     HPI  Discussed the use of AI scribe software for clinical note transcription with the patient, who gave verbal consent to proceed.  History of Present Illness Timothy Bennett is a 44 year old male who presents with concerns about fluctuating weight and gout management.  He has experienced significant fluctuations in his weight, with a recent weight of 199 pounds, down from 204 pounds a week ago, and 183 pounds in November. He is frustrated with his inability to maintain a stable weight and has not used the prescribed medication Zepbound  due to insurance changes. He has attempted weight loss through a very low-calorie diet, consuming under 600 calories per day, but finds it unsustainable. He feels the weight does not come off if he consumes over 1000 calories. 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  He has a history of sleep apnea but was unable to tolerate the CPAP machine, often removing it unknowingly during the night. His insurance required a certain number of hours of use, which he could not meet, leading him to return the machine.  He discusses his recent experience with gout, which initially resolved with prednisone  but recurred within five days. He has been taking allopurinol  daily and is unsure about the use of colchicine , which was prescribed for acute attacks. He expresses concern about taking multiple medications simultaneously. He recalls consuming a lot of sodas over the weekend, which he usually avoids, and wonders if this contributed to the gout flare-up.  He mentions that he has not had  his thyroid levels checked and is unsure if this has been done in the past.  No regular consumption of sweets and baked goods. Reports stress eating, particularly during certain times of the year.   Review of Systems  All other systems reviewed and are negative.   Past Medical History:  Diagnosis Date   Gout    Hypertension     History reviewed. No pertinent surgical history.  Outpatient Medications Prior to Visit  Medication Sig Dispense Refill   allopurinol  (ZYLOPRIM ) 100 MG tablet Take 1 tablet (100 mg total) by mouth daily. 90 tablet 3   colchicine  0.6 MG tablet Take 1 tablet (0.6 mg total) by mouth daily. (Patient not taking: Reported on 07/26/2024) 30 tablet 2   lisinopril  (ZESTRIL ) 10 MG tablet Take 1 tablet (10 mg total) by mouth daily. 90 tablet 3   meloxicam  (MOBIC ) 7.5 MG tablet Take 1 tablet (7.5 mg total) by mouth daily. (Patient not taking: Reported on 07/26/2024) 30 tablet 0   predniSONE  (STERAPRED UNI-PAK 21 TAB) 10 MG (21) TBPK tablet Use as directed. (Patient not taking: Reported on 07/26/2024) 21 each 0   No facility-administered medications prior to visit.    Allergies[1]     Objective:    BP 117/89   Pulse 77   Temp 97.8 F (36.6 C)   Ht 5' 8 (1.727 m)   Wt 199 lb 6.4 oz (90.4 kg)   SpO2 97%   BMI 30.32 kg/m  BP Readings from Last 3 Encounters:  07/26/24 117/89  07/15/24 (!) 142/92  05/08/24 118/89   Wt Readings from Last 3 Encounters:  07/26/24 199 lb 6.4 oz (90.4 kg)  07/15/24 206 lb 4.8 oz (93.6 kg)  03/22/24 191 lb 9.6 oz (86.9 kg)      Physical Exam Vitals and nursing note reviewed.  Constitutional:      Appearance: Normal appearance. He is obese.  HENT:     Head: Normocephalic and atraumatic.  Skin:    General: Skin is warm and dry.     Capillary Refill: Capillary refill takes less than 2 seconds.  Neurological:     General: No focal deficit present.     Mental Status: He is alert and oriented to person, place, and time. Mental  status is at baseline.  Psychiatric:        Mood and Affect: Mood normal.        Behavior: Behavior normal.        Thought Content: Thought content normal.        Judgment: Judgment normal.       No results found for any visits on 07/26/24.     Assessment & Plan:   Problem List Items Addressed This Visit       Other   Class 1 obesity with serious comorbidity and body mass index (BMI) of 31.0 to 31.9 in adult - Primary   Relevant Orders   CBC with Differential/Platelet   Comprehensive metabolic panel with GFR   Lipid panel   Hemoglobin A1c   TSH   VITAMIN D 25 Hydroxy (Vit-D Deficiency, Fractures)   Other Visit Diagnoses       Gouty arthritis of right great toe           Assessment and Plan Assessment & Plan Class 1 obesity Weight fluctuating due to unsustainable very low-calorie diet. Discussed importance of nutrition and lifestyle changes before medication. - Recommended 1800 calories per day with 90 grams of protein. - Heart healthy diet with reduction in sweets and sweetened beverages. Nutritious choices with fewer carbohydrates. - Advised 30 minutes of exercise daily. - Ordered thyroid level to rule out underlying issues. - Scheduled follow-up in 4-8 weeks to assess progress and discuss potential medication options.  Gout Recent flare-up despite prednisone . High-purine foods and sweetened beverages likely contributors. Discussed colchicine  for acute management. - Advised taking colchicine  for acute gout flare-up. - Continue allopurinol  daily. - Educated on low-purine diet and avoiding high-purine foods and sweetened beverages.    No orders of the defined types were placed in this encounter.   Return in about 2 months (around 09/23/2024) for annual physical with labs 1 week prior.  Jeoffrey GORMAN Barrio, FNP [facility] [facility]      [1] No Known Allergies  "

## 2024-09-08 ENCOUNTER — Other Ambulatory Visit

## 2024-09-13 ENCOUNTER — Encounter: Admitting: Family Medicine

## 2024-09-20 ENCOUNTER — Other Ambulatory Visit

## 2024-09-28 ENCOUNTER — Encounter: Admitting: Family Medicine

## 2024-11-02 ENCOUNTER — Ambulatory Visit: Admitting: Adult Health
